# Patient Record
Sex: Male | Born: 1985 | Race: Black or African American | Hispanic: No | Marital: Single | Smoking: Current some day smoker
Health system: Southern US, Community
[De-identification: ages and names within clinical notes are randomized; demographics above are authoritative.]

## PROBLEM LIST (undated history)

## (undated) DIAGNOSIS — I10 Essential (primary) hypertension: Secondary | ICD-10-CM

## (undated) DIAGNOSIS — W3400XA Accidental discharge from unspecified firearms or gun, initial encounter: Secondary | ICD-10-CM

## (undated) HISTORY — PX: ABDOMINAL SURGERY: SHX537

---
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
## 2008-07-24 ENCOUNTER — Emergency Department (HOSPITAL_COMMUNITY): Admission: EM | Admit: 2008-07-24 | Discharge: 2008-07-24 | Payer: Self-pay | Admitting: Emergency Medicine

## 2009-09-10 ENCOUNTER — Encounter (INDEPENDENT_AMBULATORY_CARE_PROVIDER_SITE_OTHER): Payer: Self-pay | Admitting: General Surgery

## 2009-09-10 ENCOUNTER — Inpatient Hospital Stay (HOSPITAL_COMMUNITY): Admission: AC | Admit: 2009-09-10 | Discharge: 2009-09-18 | Payer: Self-pay

## 2009-09-10 ENCOUNTER — Ambulatory Visit: Payer: Self-pay | Admitting: Vascular Surgery

## 2009-09-25 ENCOUNTER — Ambulatory Visit: Payer: Self-pay | Admitting: Family Medicine

## 2009-09-25 DIAGNOSIS — S35516A Injury of unspecified iliac vein, initial encounter: Secondary | ICD-10-CM | POA: Insufficient documentation

## 2009-09-25 LAB — CONVERTED CEMR LAB: INR: 1.7

## 2009-10-02 ENCOUNTER — Ambulatory Visit: Payer: Self-pay | Admitting: Family Medicine

## 2009-10-02 LAB — CONVERTED CEMR LAB: INR: 1.7

## 2009-10-16 ENCOUNTER — Ambulatory Visit: Payer: Self-pay | Admitting: Family Medicine

## 2009-10-16 LAB — CONVERTED CEMR LAB: INR: 2

## 2009-10-30 ENCOUNTER — Ambulatory Visit: Payer: Self-pay | Admitting: Family Medicine

## 2009-10-30 LAB — CONVERTED CEMR LAB: INR: 1.6

## 2010-04-18 ENCOUNTER — Emergency Department (HOSPITAL_COMMUNITY): Admission: EM | Admit: 2010-04-18 | Discharge: 2010-04-18 | Payer: Self-pay | Admitting: Emergency Medicine

## 2010-12-08 ENCOUNTER — Encounter: Payer: Self-pay | Admitting: *Deleted

## 2011-02-06 LAB — BASIC METABOLIC PANEL
BUN: 13 mg/dL (ref 6–23)
BUN: 5 mg/dL — ABNORMAL LOW (ref 6–23)
BUN: 6 mg/dL (ref 6–23)
BUN: 8 mg/dL (ref 6–23)
CO2: 23 mEq/L (ref 19–32)
CO2: 27 mEq/L (ref 19–32)
CO2: 29 mEq/L (ref 19–32)
CO2: 29 mEq/L (ref 19–32)
Calcium: 7.1 mg/dL — ABNORMAL LOW (ref 8.4–10.5)
Calcium: 7.2 mg/dL — ABNORMAL LOW (ref 8.4–10.5)
Calcium: 7.4 mg/dL — ABNORMAL LOW (ref 8.4–10.5)
Calcium: 8.1 mg/dL — ABNORMAL LOW (ref 8.4–10.5)
Chloride: 102 mEq/L (ref 96–112)
Chloride: 120 meq/L — ABNORMAL HIGH (ref 96–112)
Creatinine, Ser: 0.67 mg/dL (ref 0.4–1.5)
Creatinine, Ser: 0.67 mg/dL (ref 0.4–1.5)
Creatinine, Ser: 0.7 mg/dL (ref 0.4–1.5)
Creatinine, Ser: 0.8 mg/dL (ref 0.4–1.5)
GFR calc Af Amer: >60 mL/min (ref >60)
GFR calc Af Amer: >60 mL/min (ref >60)
GFR calc Af Amer: >60 mL/min (ref >60)
GFR calc non Af Amer: >60 mL/min (ref >60)
GFR calc non Af Amer: >60 mL/min (ref >60)
GFR calc non Af Amer: >60 mL/min (ref >60)
GFR calc non Af Amer: >60 mL/min (ref >60)
Glucose, Bld: 117 mg/dL — ABNORMAL HIGH (ref 70–99)
Glucose, Bld: 117 mg/dL — ABNORMAL HIGH (ref 70–99)
Glucose, Bld: 122 mg/dL — ABNORMAL HIGH (ref 70–99)
Glucose, Bld: 143 mg/dL — ABNORMAL HIGH (ref 70–99)
Potassium: 3.4 mEq/L — ABNORMAL LOW (ref 3.5–5.1)
Potassium: 3.4 mEq/L — ABNORMAL LOW (ref 3.5–5.1)
Potassium: 3.5 mEq/L (ref 3.5–5.1)
Potassium: 3.8 mEq/L (ref 3.5–5.1)
Sodium: 136 mEq/L (ref 135–145)
Sodium: 139 mEq/L (ref 135–145)
Sodium: 149 mEq/L — ABNORMAL HIGH (ref 135–145)

## 2011-02-06 LAB — URINALYSIS, MICROSCOPIC ONLY
Bilirubin Urine: NEGATIVE
Nitrite: NEGATIVE
Specific Gravity, Urine: 1.029 (ref 1.005–1.030)
pH: 7 (ref 5.0–8.0)

## 2011-02-06 LAB — POCT I-STAT 3, ART BLOOD GAS (G3+)
Acid-Base Excess: 3 mmol/L — ABNORMAL HIGH (ref 0.0–2.0)
O2 Saturation: 98 %
Patient temperature: 38
TCO2: 24 mmol/L (ref 0–100)
pCO2 arterial: 43.9 mmHg (ref 35.0–45.0)
pH, Arterial: 7.306 — ABNORMAL LOW (ref 7.350–7.450)
pO2, Arterial: 110 mmHg — ABNORMAL HIGH (ref 80.0–100.0)
pO2, Arterial: 593 mmHg — ABNORMAL HIGH (ref 80.0–100.0)

## 2011-02-06 LAB — CBC
HCT: 21.5 % — ABNORMAL LOW (ref 39.0–52.0)
HCT: 24.6 % — ABNORMAL LOW (ref 39.0–52.0)
HCT: 26.5 % — ABNORMAL LOW (ref 39.0–52.0)
HCT: 26.9 % — ABNORMAL LOW (ref 39.0–52.0)
HCT: 27.2 % — ABNORMAL LOW (ref 39.0–52.0)
HCT: 27.8 % — ABNORMAL LOW (ref 39.0–52.0)
HCT: 28.6 % — ABNORMAL LOW (ref 39.0–52.0)
HCT: 29.5 % — ABNORMAL LOW (ref 39.0–52.0)
HCT: 30 % — ABNORMAL LOW (ref 39.0–52.0)
HCT: 32.1 % — ABNORMAL LOW (ref 39.0–52.0)
HCT: 37.5 % — ABNORMAL LOW (ref 39.0–52.0)
HCT: 38.1 % — ABNORMAL LOW (ref 39.0–52.0)
Hemoglobin: 10.4 g/dL — ABNORMAL LOW (ref 13.0–17.0)
Hemoglobin: 10.5 g/dL — ABNORMAL LOW (ref 13.0–17.0)
Hemoglobin: 10.6 g/dL — ABNORMAL LOW (ref 13.0–17.0)
Hemoglobin: 11.2 g/dL — ABNORMAL LOW (ref 13.0–17.0)
Hemoglobin: 13.1 g/dL (ref 13.0–17.0)
Hemoglobin: 13.1 g/dL (ref 13.0–17.0)
Hemoglobin: 7.8 g/dL — ABNORMAL LOW (ref 13.0–17.0)
Hemoglobin: 9.4 g/dL — ABNORMAL LOW (ref 13.0–17.0)
Hemoglobin: 9.5 g/dL — ABNORMAL LOW (ref 13.0–17.0)
Hemoglobin: 9.6 g/dL — ABNORMAL LOW (ref 13.0–17.0)
Hemoglobin: 9.8 g/dL — ABNORMAL LOW (ref 13.0–17.0)
MCHC: 34.7 g/dL (ref 30.0–36.0)
MCHC: 34.9 g/dL (ref 30.0–36.0)
MCHC: 35 g/dL (ref 30.0–36.0)
MCHC: 35 g/dL (ref 30.0–36.0)
MCHC: 35 g/dL (ref 30.0–36.0)
MCHC: 35.1 g/dL (ref 30.0–36.0)
MCHC: 35.1 g/dL (ref 30.0–36.0)
MCHC: 35.3 g/dL (ref 30.0–36.0)
MCHC: 35.3 g/dL (ref 30.0–36.0)
MCHC: 35.3 g/dL (ref 30.0–36.0)
MCHC: 35.4 g/dL (ref 30.0–36.0)
MCHC: 36 g/dL (ref 30.0–36.0)
MCV: 86.8 fL (ref 78.0–100.0)
MCV: 87.6 fL (ref 78.0–100.0)
MCV: 87.7 fL (ref 78.0–100.0)
MCV: 87.7 fL (ref 78.0–100.0)
MCV: 87.8 fL (ref 78.0–100.0)
MCV: 88.1 fL (ref 78.0–100.0)
MCV: 88.3 fL (ref 78.0–100.0)
MCV: 88.3 fL (ref 78.0–100.0)
MCV: 88.7 fL (ref 78.0–100.0)
MCV: 89.5 fL (ref 78.0–100.0)
MCV: 90.5 fL (ref 78.0–100.0)
Platelets: 115 10*3/uL — ABNORMAL LOW (ref 150–400)
Platelets: 24 10*3/uL — CL (ref 150–400)
Platelets: 29 10*3/uL — CL (ref 150–400)
Platelets: 58 10*3/uL — ABNORMAL LOW (ref 150–400)
Platelets: 61 10*3/uL — ABNORMAL LOW (ref 150–400)
Platelets: 61 10*3/uL — ABNORMAL LOW (ref 150–400)
Platelets: 61 10*3/uL — ABNORMAL LOW (ref 150–400)
Platelets: 63 10*3/uL — ABNORMAL LOW (ref 150–400)
Platelets: 64 10*3/uL — ABNORMAL LOW (ref 150–400)
Platelets: 65 10*3/uL — ABNORMAL LOW (ref 150–400)
Platelets: 73 10*3/uL — ABNORMAL LOW (ref 150–400)
Platelets: 74 10*3/uL — ABNORMAL LOW (ref 150–400)
RBC: 2.67 MIL/uL — ABNORMAL LOW (ref 4.22–5.81)
RBC: 3.07 MIL/uL — ABNORMAL LOW (ref 4.22–5.81)
RBC: 3.1 MIL/uL — ABNORMAL LOW (ref 4.22–5.81)
RBC: 3.18 MIL/uL — ABNORMAL LOW (ref 4.22–5.81)
RBC: 3.26 MIL/uL — ABNORMAL LOW (ref 4.22–5.81)
RBC: 3.31 MIL/uL — ABNORMAL LOW (ref 4.22–5.81)
RBC: 3.35 MIL/uL — ABNORMAL LOW (ref 4.22–5.81)
RBC: 3.4 MIL/uL — ABNORMAL LOW (ref 4.22–5.81)
RBC: 3.44 MIL/uL — ABNORMAL LOW (ref 4.22–5.81)
RBC: 3.62 MIL/uL — ABNORMAL LOW (ref 4.22–5.81)
RBC: 4.08 MIL/uL — ABNORMAL LOW (ref 4.22–5.81)
RBC: 4.25 MIL/uL (ref 4.22–5.81)
RDW: 12.8 % (ref 11.5–15.5)
RDW: 13.1 % (ref 11.5–15.5)
RDW: 13.4 % (ref 11.5–15.5)
RDW: 13.6 % (ref 11.5–15.5)
RDW: 13.7 % (ref 11.5–15.5)
RDW: 13.9 % (ref 11.5–15.5)
RDW: 14.2 % (ref 11.5–15.5)
RDW: 14.3 % (ref 11.5–15.5)
RDW: 14.4 % (ref 11.5–15.5)
RDW: 14.6 % (ref 11.5–15.5)
RDW: 14.6 % (ref 11.5–15.5)
RDW: 14.6 % (ref 11.5–15.5)
RDW: 14.7 % (ref 11.5–15.5)
RDW: 14.7 % (ref 11.5–15.5)
RDW: 14.8 % (ref 11.5–15.5)
WBC: 3.5 10*3/uL — ABNORMAL LOW (ref 4.0–10.5)
WBC: 4.1 10*3/uL (ref 4.0–10.5)
WBC: 4.7 10*3/uL (ref 4.0–10.5)
WBC: 5.4 10*3/uL (ref 4.0–10.5)
WBC: 6.1 10*3/uL (ref 4.0–10.5)
WBC: 6.1 10*3/uL (ref 4.0–10.5)
WBC: 6.6 10*3/uL (ref 4.0–10.5)
WBC: 7.1 10*3/uL (ref 4.0–10.5)
WBC: 7.1 10*3/uL (ref 4.0–10.5)
WBC: 7.1 10*3/uL (ref 4.0–10.5)
WBC: 7.2 10*3/uL (ref 4.0–10.5)
WBC: 7.4 10*3/uL (ref 4.0–10.5)

## 2011-02-06 LAB — DIFFERENTIAL
Basophils Absolute: 0 10*3/uL (ref 0.0–0.1)
Basophils Absolute: 0 10*3/uL (ref 0.0–0.1)
Basophils Relative: 0 % (ref 0–1)
Basophils Relative: 0 % (ref 0–1)
Eosinophils Absolute: 0 10*3/uL (ref 0.0–0.7)
Eosinophils Relative: 1 % (ref 0–5)
Lymphocytes Relative: 10 % — ABNORMAL LOW (ref 12–46)
Lymphocytes Relative: 27 % (ref 12–46)
Lymphs Abs: 1.6 10*3/uL (ref 0.7–4.0)
Monocytes Absolute: 0.4 10*3/uL (ref 0.1–1.0)
Monocytes Relative: 10 % (ref 3–12)
Monocytes Relative: 6 % (ref 3–12)
Neutro Abs: 4 10*3/uL (ref 1.7–7.7)
Neutro Abs: 4.6 10*3/uL (ref 1.7–7.7)
Neutrophils Relative %: 67 % (ref 43–77)
Neutrophils Relative %: 77 % (ref 43–77)

## 2011-02-06 LAB — TYPE AND SCREEN

## 2011-02-06 LAB — POCT I-STAT 7, (LYTES, BLD GAS, ICA,H+H)
Acid-base deficit: 22 mmol/L — ABNORMAL HIGH (ref 0.0–2.0)
Acid-base deficit: 3 mmol/L — ABNORMAL HIGH (ref 0.0–2.0)
Bicarbonate: 11.8 meq/L — ABNORMAL LOW (ref 20.0–24.0)
Bicarbonate: 22.8 meq/L (ref 20.0–24.0)
Calcium, Ion: 1.02 mmol/L — ABNORMAL LOW (ref 1.12–1.32)
Calcium, Ion: 1.1 mmol/L — ABNORMAL LOW (ref 1.12–1.32)
HCT: 19 % — ABNORMAL LOW (ref 39.0–52.0)
Hemoglobin: 5.4 g/dL — CL (ref 13.0–17.0)
Hemoglobin: 6.5 g/dL — CL (ref 13.0–17.0)
Hemoglobin: 8.8 g/dL — ABNORMAL LOW (ref 13.0–17.0)
Hemoglobin: 9.5 g/dL — ABNORMAL LOW (ref 13.0–17.0)
O2 Saturation: 100 %
O2 Saturation: 100 %
Patient temperature: 33.4
Patient temperature: 34
Patient temperature: 34.3
Potassium: 3.8 meq/L (ref 3.5–5.1)
Potassium: 4.4 meq/L (ref 3.5–5.1)
Potassium: 4.5 meq/L (ref 3.5–5.1)
Potassium: 5.6 meq/L — ABNORMAL HIGH (ref 3.5–5.1)
Potassium: 5.6 meq/L — ABNORMAL HIGH (ref 3.5–5.1)
Sodium: 147 meq/L — ABNORMAL HIGH (ref 135–145)
Sodium: 147 meq/L — ABNORMAL HIGH (ref 135–145)
Sodium: 150 meq/L — ABNORMAL HIGH (ref 135–145)
TCO2: 13 mmol/L (ref 0–100)
TCO2: 13 mmol/L (ref 0–100)
TCO2: 22 mmol/L (ref 0–100)
TCO2: 24 mmol/L (ref 0–100)
TCO2: 24 mmol/L (ref 0–100)
pCO2 arterial: 41 mmHg (ref 35.0–45.0)
pCO2 arterial: 42.5 mmHg (ref 35.0–45.0)
pCO2 arterial: 43.8 mmHg (ref 35.0–45.0)
pH, Arterial: 7.028 — CL (ref 7.350–7.450)
pH, Arterial: 7.296 — ABNORMAL LOW (ref 7.350–7.450)
pH, Arterial: 7.315 — ABNORMAL LOW (ref 7.350–7.450)
pH, Arterial: 7.34 — ABNORMAL LOW (ref 7.350–7.450)
pO2, Arterial: 516 mmHg — ABNORMAL HIGH (ref 80.0–100.0)
pO2, Arterial: 602 mmHg — ABNORMAL HIGH (ref 80.0–100.0)

## 2011-02-06 LAB — BASIC METABOLIC PANEL WITH GFR
BUN: 7 mg/dL (ref 6–23)
CO2: 23 meq/L (ref 19–32)
Calcium: 7.1 mg/dL — ABNORMAL LOW (ref 8.4–10.5)
Chloride: 118 meq/L — ABNORMAL HIGH (ref 96–112)
Creatinine, Ser: 0.69 mg/dL (ref 0.4–1.5)
GFR calc non Af Amer: >60 mL/min
Glucose, Bld: 141 mg/dL — ABNORMAL HIGH (ref 70–99)
Potassium: 4 meq/L (ref 3.5–5.1)
Sodium: 146 meq/L — ABNORMAL HIGH (ref 135–145)

## 2011-02-06 LAB — PREPARE FRESH FROZEN PLASMA

## 2011-02-06 LAB — COMPREHENSIVE METABOLIC PANEL
ALT: 28 U/L (ref 0–53)
Alkaline Phosphatase: 64 U/L (ref 39–117)
CO2: 11 mEq/L — ABNORMAL LOW (ref 19–32)
Chloride: 113 mEq/L — ABNORMAL HIGH (ref 96–112)
Glucose, Bld: 214 mg/dL — ABNORMAL HIGH (ref 70–99)
Potassium: 4.3 mEq/L (ref 3.5–5.1)
Sodium: 144 mEq/L (ref 135–145)
Total Bilirubin: 0.4 mg/dL (ref 0.3–1.2)
Total Protein: 6.9 g/dL (ref 6.0–8.3)

## 2011-02-06 LAB — COMPREHENSIVE METABOLIC PANEL WITH GFR
ALT: 85 U/L — ABNORMAL HIGH (ref 0–53)
AST: 65 U/L — ABNORMAL HIGH (ref 0–37)
Albumin: 1.9 g/dL — ABNORMAL LOW (ref 3.5–5.2)
Alkaline Phosphatase: 37 U/L — ABNORMAL LOW (ref 39–117)
BUN: 7 mg/dL (ref 6–23)
CO2: 29 meq/L (ref 19–32)
Calcium: 7.1 mg/dL — ABNORMAL LOW (ref 8.4–10.5)
Chloride: 114 meq/L — ABNORMAL HIGH (ref 96–112)
Creatinine, Ser: 0.87 mg/dL (ref 0.4–1.5)
GFR calc non Af Amer: >60 mL/min
Glucose, Bld: 104 mg/dL — ABNORMAL HIGH (ref 70–99)
Potassium: 3.5 meq/L (ref 3.5–5.1)
Sodium: 147 meq/L — ABNORMAL HIGH (ref 135–145)
Total Bilirubin: 1 mg/dL (ref 0.3–1.2)
Total Protein: 3.3 g/dL — ABNORMAL LOW (ref 6.0–8.3)

## 2011-02-06 LAB — URINALYSIS, ROUTINE W REFLEX MICROSCOPIC
Bilirubin Urine: NEGATIVE
Glucose, UA: NEGATIVE mg/dL
Hgb urine dipstick: NEGATIVE
Ketones, ur: NEGATIVE mg/dL
Nitrite: NEGATIVE
Protein, ur: NEGATIVE mg/dL
Specific Gravity, Urine: 1.028 (ref 1.005–1.030)
Urobilinogen, UA: 0.2 mg/dL (ref 0.0–1.0)
pH: 6 (ref 5.0–8.0)

## 2011-02-06 LAB — CULTURE, ROUTINE-GENITAL: Culture: NO GROWTH

## 2011-02-06 LAB — PREPARE PLATELETS

## 2011-02-06 LAB — ABO/RH: ABO/RH(D): A POS

## 2011-02-06 LAB — PROTIME-INR
INR: 1.12 (ref 0.00–1.49)
INR: 1.2 (ref 0.00–1.49)
INR: 1.29 (ref 0.00–1.49)
INR: 1.3 (ref 0.00–1.49)
INR: 2.63 — ABNORMAL HIGH (ref 0.00–1.49)
INR: 2.9 — ABNORMAL HIGH (ref 0.00–1.49)
INR: 3.05 — ABNORMAL HIGH (ref 0.00–1.49)
Prothrombin Time: 14 seconds (ref 11.6–15.2)
Prothrombin Time: 14.1 seconds (ref 11.6–15.2)
Prothrombin Time: 14.3 seconds (ref 11.6–15.2)
Prothrombin Time: 14.3 seconds (ref 11.6–15.2)
Prothrombin Time: 15.1 s (ref 11.6–15.2)
Prothrombin Time: 16 seconds — ABNORMAL HIGH (ref 11.6–15.2)
Prothrombin Time: 16.1 s — ABNORMAL HIGH (ref 11.6–15.2)
Prothrombin Time: 16.4 seconds — ABNORMAL HIGH (ref 11.6–15.2)
Prothrombin Time: 27.9 s — ABNORMAL HIGH (ref 11.6–15.2)
Prothrombin Time: 33.4 s — ABNORMAL HIGH (ref 11.6–15.2)

## 2011-02-06 LAB — POCT I-STAT 4, (NA,K, GLUC, HGB,HCT)
HCT: 24 % — ABNORMAL LOW (ref 39.0–52.0)
Hemoglobin: 8.2 g/dL — ABNORMAL LOW (ref 13.0–17.0)

## 2011-02-06 LAB — APTT: aPTT: 95 s — ABNORMAL HIGH (ref 24–37)

## 2011-02-06 LAB — HEMOGLOBIN AND HEMATOCRIT, BLOOD: Hemoglobin: 9.8 g/dL — ABNORMAL LOW (ref 13.0–17.0)

## 2011-02-06 LAB — D-DIMER, QUANTITATIVE: D-Dimer, Quant: 9.66 ug{FEU}/mL — ABNORMAL HIGH (ref 0.00–0.48)

## 2011-11-23 ENCOUNTER — Emergency Department (HOSPITAL_COMMUNITY)
Admission: EM | Admit: 2011-11-23 | Discharge: 2011-11-23 | Disposition: A | Discharge status: Home / Self Care | Payer: Self-pay | Attending: Emergency Medicine | Admitting: Emergency Medicine

## 2011-11-23 ENCOUNTER — Encounter (HOSPITAL_COMMUNITY): Payer: Self-pay

## 2011-11-23 DIAGNOSIS — L0201 Cutaneous abscess of face: Secondary | ICD-10-CM | POA: Insufficient documentation

## 2011-11-23 DIAGNOSIS — R220 Localized swelling, mass and lump, head: Secondary | ICD-10-CM | POA: Insufficient documentation

## 2011-11-23 DIAGNOSIS — L03211 Cellulitis of face: Secondary | ICD-10-CM

## 2011-11-23 MED ORDER — DOXYCYCLINE HYCLATE 100 MG PO CAPS: 100.0000 mg | ORAL_CAPSULE | Freq: Two times a day (BID) | ORAL | Status: AC
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
## 2011-11-23 NOTE — ED Notes (Signed)
Yesterday started having pain today woke up with facial swelling on left lower jaw.

## 2011-11-24 NOTE — ED Provider Notes (Signed)
Medical screening examination/treatment/procedure(s) were performed by non-physician practitioner and as supervising physician I was immediately available for consultation/collaboration.  Shihab States, MD 11/24/11 1145 

## 2012-01-25 NOTE — ED Provider Notes (Signed)
Medical screening examination/treatment/procedure(s) were performed by non-physician practitioner and as supervising physician I was immediately available for consultation/collaboration.  Milo Schreier, MD 01/25/12 2050 

## 2013-02-18 ENCOUNTER — Emergency Department (HOSPITAL_COMMUNITY)
Admission: EM | Admit: 2013-02-18 | Discharge: 2013-02-18 | Disposition: A | Discharge status: Home / Self Care | Payer: Self-pay | Attending: Emergency Medicine | Admitting: Emergency Medicine

## 2013-02-18 ENCOUNTER — Encounter (HOSPITAL_COMMUNITY): Payer: Self-pay | Admitting: *Deleted

## 2013-02-18 DIAGNOSIS — F17200 Nicotine dependence, unspecified, uncomplicated: Secondary | ICD-10-CM | POA: Insufficient documentation

## 2013-02-18 DIAGNOSIS — H1131 Conjunctival hemorrhage, right eye: Secondary | ICD-10-CM

## 2013-02-18 DIAGNOSIS — Z87828 Personal history of other (healed) physical injury and trauma: Secondary | ICD-10-CM | POA: Insufficient documentation

## 2013-02-18 DIAGNOSIS — H1130 Conjunctival hemorrhage, unspecified eye: Secondary | ICD-10-CM | POA: Insufficient documentation

## 2013-02-18 DIAGNOSIS — I10 Essential (primary) hypertension: Secondary | ICD-10-CM | POA: Insufficient documentation

## 2013-02-18 HISTORY — DX: Accidental discharge from unspecified firearms or gun, initial encounter: W34.00XA

## 2013-02-18 HISTORY — DX: Essential (primary) hypertension: I10
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
## 2013-02-18 NOTE — ED Notes (Signed)
To ED for eval of redness to right eye after vomiting yesterday. No vision changes.

## 2013-02-18 NOTE — ED Provider Notes (Signed)
Medical screening examination/treatment/procedure(s) were performed by non-physician practitioner and as supervising physician I was immediately available for consultation/collaboration.  Samer Dutton, MD 02/18/13 1624 

## 2013-08-02 ENCOUNTER — Emergency Department (INDEPENDENT_AMBULATORY_CARE_PROVIDER_SITE_OTHER)
Admission: EM | Admit: 2013-08-02 | Discharge: 2013-08-02 | Disposition: A | Discharge status: Home / Self Care | Payer: Self-pay | Source: Home / Self Care | Attending: Emergency Medicine | Admitting: Emergency Medicine

## 2013-08-02 ENCOUNTER — Other Ambulatory Visit (HOSPITAL_COMMUNITY)
Admission: RE | Admit: 2013-08-02 | Discharge: 2013-08-02 | Disposition: A | Discharge status: Home / Self Care | Payer: Self-pay | Source: Ambulatory Visit | Attending: Family Medicine | Admitting: Family Medicine

## 2013-08-02 ENCOUNTER — Encounter (HOSPITAL_COMMUNITY): Payer: Self-pay | Admitting: Emergency Medicine

## 2013-08-02 DIAGNOSIS — L0291 Cutaneous abscess, unspecified: Secondary | ICD-10-CM

## 2013-08-02 DIAGNOSIS — Z202 Contact with and (suspected) exposure to infections with a predominantly sexual mode of transmission: Secondary | ICD-10-CM

## 2013-08-02 DIAGNOSIS — Z2089 Contact with and (suspected) exposure to other communicable diseases: Secondary | ICD-10-CM

## 2013-08-02 DIAGNOSIS — Z113 Encounter for screening for infections with a predominantly sexual mode of transmission: Secondary | ICD-10-CM | POA: Insufficient documentation

## 2013-08-02 DIAGNOSIS — I10 Essential (primary) hypertension: Secondary | ICD-10-CM

## 2013-08-02 DIAGNOSIS — B356 Tinea cruris: Secondary | ICD-10-CM

## 2013-08-02 LAB — POCT I-STAT, CHEM 8
Calcium, Ion: 1.08 mmol/L — ABNORMAL LOW (ref 1.12–1.23)
Chloride: 105 mEq/L (ref 96–112)
HCT: 50 % (ref 39.0–52.0)
Hemoglobin: 17 g/dL (ref 13.0–17.0)
TCO2: 25 mmol/L (ref 0–100)

## 2013-08-02 MED ORDER — MUPIROCIN CALCIUM 2 % NA OINT: TOPICAL_OINTMENT | NASAL | Status: DC

## 2013-08-02 MED ORDER — LISINOPRIL 10 MG PO TABS: 10.0000 mg | ORAL_TABLET | Freq: Every day | ORAL | Status: DC

## 2013-08-02 MED ORDER — AZITHROMYCIN 250 MG PO TABS
ORAL_TABLET | ORAL | Status: AC
  Filled 2013-08-02: qty 4

## 2013-08-02 MED ORDER — CEFTRIAXONE SODIUM 250 MG IJ SOLR
INTRAMUSCULAR | Status: AC
  Filled 2013-08-02: qty 250

## 2013-08-02 MED ORDER — SULFAMETHOXAZOLE-TRIMETHOPRIM 800-160 MG PO TABS: 1.0000 | ORAL_TABLET | Freq: Two times a day (BID) | ORAL | Status: DC

## 2013-08-02 MED ORDER — IBUPROFEN 800 MG PO TABS
ORAL_TABLET | ORAL | Status: AC
  Filled 2013-08-02: qty 1

## 2013-08-02 MED ORDER — KETOCONAZOLE 2 % EX CREA: TOPICAL_CREAM | Freq: Every day | CUTANEOUS | Status: DC

## 2013-08-02 MED ORDER — AZITHROMYCIN 250 MG PO TABS
1000.0000 mg | ORAL_TABLET | Freq: Once | ORAL | Status: AC
  Administered 2013-08-02: 1000 mg via ORAL

## 2013-08-02 MED ORDER — CEFTRIAXONE SODIUM 250 MG IJ SOLR
250.0000 mg | Freq: Once | INTRAMUSCULAR | Status: AC
  Administered 2013-08-02: 250 mg via INTRAMUSCULAR

## 2013-08-02 MED ORDER — LIDOCAINE HCL (PF) 1 % IJ SOLN
INTRAMUSCULAR | Status: AC
  Filled 2013-08-02: qty 5

## 2013-08-02 MED ORDER — IBUPROFEN 800 MG PO TABS
800.0000 mg | ORAL_TABLET | Freq: Once | ORAL | Status: AC
  Administered 2013-08-02: 800 mg via ORAL

## 2013-08-02 NOTE — ED Provider Notes (Signed)
Medical screening examination/treatment/procedure(s) were performed by non-physician practitioner and as supervising physician I was immediately available for consultation/collaboration.  Leslee Home, M.D.  Reuben Likes, MD 08/02/13 Rickey Primus

## 2013-08-02 NOTE — ED Notes (Signed)
Abscess right elbow, open wound, draining.  Patient reports a history of having an abscess on right left before
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
## 2013-08-05 LAB — CULTURE, ROUTINE-ABSCESS

## 2013-08-06 NOTE — ED Notes (Signed)
Chart review.

## 2013-08-09 ENCOUNTER — Telehealth (HOSPITAL_COMMUNITY): Payer: Self-pay | Admitting: *Deleted

## 2013-08-09 NOTE — ED Notes (Signed)
GC/Chlamydia/Trich neg., Abscess culture R elbow:  Abundant MRSA.  Pt. adequately treated with Septra DS.  I called pt. Wife answered and offered to put him on 3 way call.  I told her I needed to give him the results and asked her to have him call me.  She said " well if he has something then I have it." I asked her, to have him call me.  A few minutes later a person called back with a male sounding voice identifying themselves as Mr. Lobdell. I verified the SS number and address. The address in the system is [address] and she said [address]. I said it was not correct.  She gave 2 other street names and then said "I don't see what the problem is, just give me the results." I told her I was hesitant to give the results. She asked to speak to my supervisor. Dois Davenport was not here, so I asked Michiel Cowboy RN to talk to her.  She also felt like this person sounded like a male and instructed them to come with a picture ID to get the results.

## 2013-08-09 NOTE — ED Notes (Addendum)
I called pt. Pt. verified x 2 and given results.  Pt. told he was adequately treated with Septra DS.  I reviewed the [facility] MRSA instructions with him.  Pt. voiced understanding.  This person sounded like a man and nothing like the person I talked to on Friday.  Address also given as [address] and corrected in the system.  Vassie Moselle 08/09/2013

## 2013-10-20 ENCOUNTER — Emergency Department (HOSPITAL_COMMUNITY)
Admission: EM | Admit: 2013-10-20 | Discharge: 2013-10-20 | Disposition: A | Discharge status: Home / Self Care | Payer: Self-pay | Attending: Emergency Medicine | Admitting: Emergency Medicine

## 2013-10-20 ENCOUNTER — Encounter (HOSPITAL_COMMUNITY): Payer: Self-pay | Admitting: Emergency Medicine

## 2013-10-20 ENCOUNTER — Emergency Department (HOSPITAL_COMMUNITY): Payer: Self-pay

## 2013-10-20 DIAGNOSIS — R0602 Shortness of breath: Secondary | ICD-10-CM | POA: Insufficient documentation

## 2013-10-20 DIAGNOSIS — Z79899 Other long term (current) drug therapy: Secondary | ICD-10-CM | POA: Insufficient documentation

## 2013-10-20 DIAGNOSIS — I10 Essential (primary) hypertension: Secondary | ICD-10-CM | POA: Insufficient documentation

## 2013-10-20 DIAGNOSIS — R61 Generalized hyperhidrosis: Secondary | ICD-10-CM | POA: Insufficient documentation

## 2013-10-20 DIAGNOSIS — F17200 Nicotine dependence, unspecified, uncomplicated: Secondary | ICD-10-CM | POA: Insufficient documentation

## 2013-10-20 DIAGNOSIS — B9789 Other viral agents as the cause of diseases classified elsewhere: Secondary | ICD-10-CM

## 2013-10-20 DIAGNOSIS — R51 Headache: Secondary | ICD-10-CM | POA: Insufficient documentation

## 2013-10-20 DIAGNOSIS — J069 Acute upper respiratory infection, unspecified: Secondary | ICD-10-CM | POA: Insufficient documentation

## 2013-10-20 MED ORDER — ALBUTEROL SULFATE HFA 108 (90 BASE) MCG/ACT IN AERS
2.0000 | INHALATION_SPRAY | Freq: Once | RESPIRATORY_TRACT | Status: AC
  Administered 2013-10-20: 2 via RESPIRATORY_TRACT
  Filled 2013-10-20: qty 6.7

## 2013-10-20 MED ORDER — HYDROCOD POLST-CHLORPHEN POLST 10-8 MG/5ML PO LQCR: 5.0000 mL | Freq: Two times a day (BID) | ORAL | Status: DC | PRN

## 2013-10-20 NOTE — ED Notes (Signed)
Pt states he's had a productive cough with yellow sputum x 4 days.  Pt states he thinks he has bronchitis.  Pt also c/o headache rated 8/10.  Pt has not taken his temp, but feels he may have a fever.
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
## 2013-10-20 NOTE — Discharge Instructions (Signed)
Read the information below.  Use the prescribed medication as directed.  Please discuss all new medications with your pharmacist.  You may return to the Emergency Department at any time for worsening condition or any new symptoms that concern you.  If you develop high fevers that do not resolve with tylenol or ibuprofen, you have difficulty swallowing or breathing, or you are unable to tolerate fluids by mouth, return to the ER for a recheck.      Antibiotic Nonuse  Your caregiver felt that the infection or problem was not one that would be helped with an antibiotic. Infections may be caused by viruses or bacteria. Only a caregiver can tell which one of these is the likely cause of an illness. A cold is the most common cause of infection in both adults and children. A cold is a virus. Antibiotic treatment will have no effect on a viral infection. Viruses can lead to many lost days of work caring for sick children and many missed days of school. Children may catch as many as 10 "colds" or "flus" per year during which they can be tearful, cranky, and uncomfortable. The goal of treating a virus is aimed at keeping the ill person comfortable. Antibiotics are medications used to help the body fight bacterial infections. There are relatively few types of bacteria that cause infections but there are hundreds of viruses. While both viruses and bacteria cause infection they are very different types of germs. A viral infection will typically go away by itself within 7 to 10 days. Bacterial infections may spread or get worse without antibiotic treatment. Examples of bacterial infections are:  Sore throats (like strep throat or tonsillitis).  Infection in the lung (pneumonia).  Ear and skin infections. Examples of viral infections are:  Colds or flus.  Most coughs and bronchitis.  Sore throats not caused by Strep.  Runny noses. It is often best not to take an antibiotic when a viral infection is the cause  of the problem. Antibiotics can kill off the helpful bacteria that we have inside our body and allow harmful bacteria to start growing. Antibiotics can cause side effects such as allergies, nausea, and diarrhea without helping to improve the symptoms of the viral infection. Additionally, repeated uses of antibiotics can cause bacteria inside of our body to become resistant. That resistance can be passed onto harmful bacterial. The next time you have an infection it may be harder to treat if antibiotics are used when they are not needed. Not treating with antibiotics allows our own immune system to develop and take care of infections more efficiently. Also, antibiotics will work better for us when they are prescribed for bacterial infections. Treatments for a child that is ill may include:  Give extra fluids throughout the day to stay hydrated.  Get plenty of rest.  Only give your child over-the-counter or prescription medicines for pain, discomfort, or fever as directed by your caregiver.  The use of a cool mist humidifier may help stuffy noses.  Cold medications if suggested by your caregiver. Your caregiver may decide to start you on an antibiotic if:  The problem you were seen for today continues for a longer length of time than expected.  You develop a secondary bacterial infection. SEEK MEDICAL CARE IF:  Fever lasts longer than 5 days.  Symptoms continue to get worse after 5 to 7 days or become severe.  Difficulty in breathing develops.  Signs of dehydration develop (poor drinking, rare urinating, dark colored  urine).  Changes in behavior or worsening tiredness (listlessness or lethargy). Document Released: 12/30/2001 Document Revised: 01/13/2012 Document Reviewed: 06/28/2009 [facility] Patient Information 2014 [place], [place].  Viral Infections A viral infection can be caused by different types of viruses.Most viral infections are not serious and resolve on their own. However,  some infections may cause severe symptoms and may lead to further complications. SYMPTOMS Viruses can frequently cause:  Minor sore throat.  Aches and pains.  Headaches.  Runny nose.  Different types of rashes.  Watery eyes.  Tiredness.  Cough.  Loss of appetite.  Gastrointestinal infections, resulting in nausea, vomiting, and diarrhea. These symptoms do not respond to antibiotics because the infection is not caused by bacteria. However, you might catch a bacterial infection following the viral infection. This is sometimes called a "superinfection." Symptoms of such a bacterial infection may include:  Worsening sore throat with pus and difficulty swallowing.  Swollen neck glands.  Chills and a high or persistent fever.  Severe headache.  Tenderness over the sinuses.  Persistent overall ill feeling (malaise), muscle aches, and tiredness (fatigue).  Persistent cough.  Yellow, green, or brown mucus production with coughing. HOME CARE INSTRUCTIONS   Only take over-the-counter or prescription medicines for pain, discomfort, diarrhea, or fever as directed by your caregiver.  Drink enough water and fluids to keep your urine clear or pale yellow. Sports drinks can provide valuable electrolytes, sugars, and hydration.  Get plenty of rest and maintain proper nutrition. Soups and broths with crackers or rice are fine. SEEK IMMEDIATE MEDICAL CARE IF:   You have severe headaches, shortness of breath, chest pain, neck pain, or an unusual rash.  You have uncontrolled vomiting, diarrhea, or you are unable to keep down fluids.  You or your child has an oral temperature above 102 F (38.9 C), not controlled by medicine.  Your baby is older than 3 months with a rectal temperature of 102 F (38.9 C) or higher.  Your baby is 55 months old or younger with a rectal temperature of 100.4 F (38 C) or higher. MAKE SURE YOU:   Understand these instructions.  Will watch your  condition.  Will get help right away if you are not doing well or get worse. Document Released: 07/31/2005 Document Revised: 01/13/2012 Document Reviewed: 02/25/2011 [facility] Patient Information 2014 [place], [place].

## 2013-10-21 NOTE — ED Provider Notes (Signed)
Medical screening examination/treatment/procedure(s) were performed by non-physician practitioner and as supervising physician I was immediately available for consultation/collaboration.  Flint Melter, MD 10/21/13 [phone]

## 2013-12-02 ENCOUNTER — Emergency Department (HOSPITAL_BASED_OUTPATIENT_CLINIC_OR_DEPARTMENT_OTHER)
Admission: EM | Admit: 2013-12-02 | Discharge: 2013-12-02 | Disposition: A | Discharge status: Home / Self Care | Payer: Self-pay | Attending: Emergency Medicine | Admitting: Emergency Medicine

## 2013-12-02 ENCOUNTER — Encounter (HOSPITAL_BASED_OUTPATIENT_CLINIC_OR_DEPARTMENT_OTHER): Payer: Self-pay | Admitting: Emergency Medicine

## 2013-12-02 DIAGNOSIS — Z87828 Personal history of other (healed) physical injury and trauma: Secondary | ICD-10-CM | POA: Insufficient documentation

## 2013-12-02 DIAGNOSIS — IMO0001 Reserved for inherently not codable concepts without codable children: Secondary | ICD-10-CM

## 2013-12-02 DIAGNOSIS — IMO0002 Reserved for concepts with insufficient information to code with codable children: Secondary | ICD-10-CM | POA: Insufficient documentation

## 2013-12-02 DIAGNOSIS — Z79899 Other long term (current) drug therapy: Secondary | ICD-10-CM | POA: Insufficient documentation

## 2013-12-02 DIAGNOSIS — F17200 Nicotine dependence, unspecified, uncomplicated: Secondary | ICD-10-CM | POA: Insufficient documentation

## 2013-12-02 DIAGNOSIS — I10 Essential (primary) hypertension: Secondary | ICD-10-CM | POA: Insufficient documentation

## 2013-12-02 MED ORDER — HYDROCODONE-ACETAMINOPHEN 5-325 MG PO TABS: 1.0000 | ORAL_TABLET | Freq: Four times a day (QID) | ORAL | Status: DC | PRN

## 2013-12-02 MED ORDER — BUPIVACAINE HCL 0.25 % IJ SOLN
INTRAMUSCULAR | Status: AC
  Administered 2013-12-02: 01:00:00
  Filled 2013-12-02: qty 1

## 2013-12-02 NOTE — Discharge Instructions (Signed)
Paronychia Paronychia is an inflammatory reaction involving the folds of the skin surrounding the fingernail. This is commonly caused by an infection in the skin around a nail. The most common cause of paronychia is frequent wetting of the hands (as seen with bartenders, food servers, nurses or others who wet their hands). This makes the skin around the fingernail susceptible to infection by bacteria (germs) or fungus. Other predisposing factors are:  Aggressive manicuring.  Nail biting.  Thumb sucking. The most common cause is a staphylococcal (a type of germ) infection, or a fungal (Candida) infection. When caused by a germ, it usually comes on suddenly with redness, swelling, pus and is often painful. It may get under the nail and form an abscess (collection of pus), or form an abscess around the nail. If the nail itself is infected with a fungus, the treatment is usually prolonged and may require oral medicine for up to one year. Your caregiver will determine the length of time treatment is required. The paronychia caused by bacteria (germs) may largely be avoided by not pulling on hangnails or picking at cuticles. When the infection occurs at the tips of the finger it is called felon. When the cause of paronychia is from the herpes simplex virus (HSV) it is called herpetic whitlow. TREATMENT  When an abscess is present treatment is often incision and drainage. This means that the abscess must be cut open so the pus can get out. When this is done, the following home care instructions should be followed. HOME CARE INSTRUCTIONS   It is important to keep the affected fingers very dry. Rubber or plastic gloves over cotton gloves should be used whenever the hand must be placed in water.  Keep wound clean, dry and dressed as suggested by your caregiver between warm soaks or warm compresses.  Soak in warm water for fifteen to twenty minutes three to four times per day for bacterial infections. Fungal  infections are very difficult to treat, so often require treatment for long periods of time.  Only take over-the-counter or prescription medicines for pain, discomfort, or fever as directed by your caregiver. SEEK IMMEDIATE MEDICAL CARE IF:  You have increased redness, swelling, or pain in the wound.  You notice increased pus coming from the wound.  You have a fever.  You notice a bad smell coming from the wound or dressing. Document Released: 04/16/2001 Document Revised: 01/13/2012 Document Reviewed: 12/16/2008 [facility] Patient Information 2014 [place].
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
## 2013-12-02 NOTE — ED Notes (Signed)
Pt has not had BP meds x 1 month

## 2013-12-02 NOTE — ED Notes (Signed)
Swelling and pain to right middle finger for few days, denies drainage or fever

## 2015-06-18 ENCOUNTER — Encounter (HOSPITAL_BASED_OUTPATIENT_CLINIC_OR_DEPARTMENT_OTHER): Payer: Self-pay | Admitting: Emergency Medicine

## 2015-06-18 ENCOUNTER — Emergency Department (HOSPITAL_BASED_OUTPATIENT_CLINIC_OR_DEPARTMENT_OTHER)
Admission: EM | Admit: 2015-06-18 | Discharge: 2015-06-18 | Disposition: A | Discharge status: Home / Self Care | Payer: Self-pay | Attending: Emergency Medicine | Admitting: Emergency Medicine

## 2015-06-18 DIAGNOSIS — Z87828 Personal history of other (healed) physical injury and trauma: Secondary | ICD-10-CM | POA: Insufficient documentation

## 2015-06-18 DIAGNOSIS — I10 Essential (primary) hypertension: Secondary | ICD-10-CM | POA: Insufficient documentation

## 2015-06-18 DIAGNOSIS — Z720 Tobacco use: Secondary | ICD-10-CM | POA: Insufficient documentation

## 2015-06-18 LAB — BASIC METABOLIC PANEL
ANION GAP: 9 (ref 5–15)
BUN: 13 mg/dL (ref 6–20)
CO2: 29 mmol/L (ref 22–32)
Calcium: 9.1 mg/dL (ref 8.9–10.3)
Chloride: 100 mmol/L — ABNORMAL LOW (ref 101–111)
Creatinine, Ser: 1.06 mg/dL (ref 0.61–1.24)
GFR calc non Af Amer: >60 mL/min (ref >60)
Glucose, Bld: 96 mg/dL (ref 65–99)
POTASSIUM: 3.4 mmol/L — AB (ref 3.5–5.1)
SODIUM: 138 mmol/L (ref 135–145)

## 2015-06-18 MED ORDER — LISINOPRIL 10 MG PO TABS: 10.0000 mg | ORAL_TABLET | Freq: Every day | ORAL | Status: DC

## 2015-06-18 MED ORDER — LISINOPRIL 10 MG PO TABS
20.0000 mg | ORAL_TABLET | Freq: Once | ORAL | Status: AC
  Administered 2015-06-18: 20 mg via ORAL
  Filled 2015-06-18: qty 2
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
## 2015-06-18 NOTE — Discharge Instructions (Signed)
DASH Eating Plan DASH stands for "Dietary Approaches to Stop Hypertension." The DASH eating plan is a healthy eating plan that has been shown to reduce high blood pressure (hypertension). Additional health benefits may include reducing the risk of type 2 diabetes mellitus, heart disease, and stroke. The DASH eating plan may also help with weight loss. WHAT DO I NEED TO KNOW ABOUT THE DASH EATING PLAN? For the DASH eating plan, you will follow these general guidelines:  Choose foods with a percent daily value for sodium of less than 5% (as listed on the food label).  Use salt-free seasonings or herbs instead of table salt or sea salt.  Check with your health care provider or pharmacist before using salt substitutes.  Eat lower-sodium products, often labeled as "lower sodium" or "no salt added."  Eat fresh foods.  Eat more vegetables, fruits, and low-fat dairy products.  Choose whole grains. Look for the word "whole" as the first word in the ingredient list.  Choose fish and skinless chicken or turkey more often than red meat. Limit fish, poultry, and meat to 6 oz (170 g) each day.  Limit sweets, desserts, sugars, and sugary drinks.  Choose heart-healthy fats.  Limit cheese to 1 oz (28 g) per day.  Eat more home-cooked food and less restaurant, buffet, and fast food.  Limit fried foods.  Cook foods using methods other than frying.  Limit canned vegetables. If you do use them, rinse them well to decrease the sodium.  When eating at a restaurant, ask that your food be prepared with less salt, or no salt if possible. WHAT FOODS CAN I EAT? Seek help from a dietitian for individual calorie needs. Grains Whole grain or whole wheat bread. Brown rice. Whole grain or whole wheat pasta. Quinoa, bulgur, and whole grain cereals. Low-sodium cereals. Corn or whole wheat flour tortillas. Whole grain cornbread. Whole grain crackers. Low-sodium crackers. Vegetables Fresh or frozen vegetables  (raw, steamed, roasted, or grilled). Low-sodium or reduced-sodium tomato and vegetable juices. Low-sodium or reduced-sodium tomato sauce and paste. Low-sodium or reduced-sodium canned vegetables.  Fruits All fresh, canned (in natural juice), or frozen fruits. Meat and Other Protein Products Ground beef (85% or leaner), grass-fed beef, or beef trimmed of fat. Skinless chicken or turkey. Ground chicken or turkey. Pork trimmed of fat. All fish and seafood. Eggs. Dried beans, peas, or lentils. Unsalted nuts and seeds. Unsalted canned beans. Dairy Low-fat dairy products, such as skim or 1% milk, 2% or reduced-fat cheeses, low-fat ricotta or cottage cheese, or plain low-fat yogurt. Low-sodium or reduced-sodium cheeses. Fats and Oils Tub margarines without trans fats. Light or reduced-fat mayonnaise and salad dressings (reduced sodium). Avocado. Safflower, olive, or canola oils. Natural peanut or almond butter. Other Unsalted popcorn and pretzels. The items listed above may not be a complete list of recommended foods or beverages. Contact your dietitian for more options. WHAT FOODS ARE NOT RECOMMENDED? Grains White bread. White pasta. White rice. Refined cornbread. Bagels and croissants. Crackers that contain trans fat. Vegetables Creamed or fried vegetables. Vegetables in a cheese sauce. Regular canned vegetables. Regular canned tomato sauce and paste. Regular tomato and vegetable juices. Fruits Dried fruits. Canned fruit in light or heavy syrup. Fruit juice. Meat and Other Protein Products Fatty cuts of meat. Ribs, chicken wings, bacon, sausage, bologna, salami, chitterlings, fatback, hot dogs, bratwurst, and packaged luncheon meats. Salted nuts and seeds. Canned beans with salt. Dairy Whole or 2% milk, cream, half-and-half, and cream cheese. Whole-fat or sweetened yogurt. Full-fat   cheeses or blue cheese. Nondairy creamers and whipped toppings. Processed cheese, cheese spreads, or cheese  curds. Condiments Onion and garlic salt, seasoned salt, table salt, and sea salt. Canned and packaged gravies. Worcestershire sauce. Tartar sauce. Barbecue sauce. Teriyaki sauce. Soy sauce, including reduced sodium. Steak sauce. Fish sauce. Oyster sauce. Cocktail sauce. Horseradish. Ketchup and mustard. Meat flavorings and tenderizers. Bouillon cubes. Hot sauce. Tabasco sauce. Marinades. Taco seasonings. Relishes. Fats and Oils Butter, stick margarine, lard, shortening, ghee, and bacon fat. Coconut, palm kernel, or palm oils. Regular salad dressings. Other Pickles and olives. Salted popcorn and pretzels. The items listed above may not be a complete list of foods and beverages to avoid. Contact your dietitian for more information. WHERE CAN I FIND MORE INFORMATION? National Heart, Lung, and [employer] Institute: www.nhlbi.nih.gov/health/health-topics/topics/dash/ Document Released: 10/10/2011 Document Revised: 03/07/2014 Document Reviewed: 08/25/2013 ExitCare Patient Information 2015 [employer]. This information is not intended to replace advice given to you by your health care provider. Make sure you discuss any questions you have with your health care provider. Hypertension Hypertension, commonly called high blood pressure, is when the force of blood pumping through your arteries is too strong. Your arteries are the blood vessels that carry blood from your heart throughout your body. A blood pressure reading consists of a higher number over a lower number, such as 110/72. The higher number (systolic) is the pressure inside your arteries when your heart pumps. The lower number (diastolic) is the pressure inside your arteries when your heart relaxes. Ideally you want your blood pressure below 120/80. Hypertension forces your heart to work harder to pump blood. Your arteries may become narrow or stiff. Having hypertension puts you at risk for heart disease, stroke, and other problems.  RISK  FACTORS Some risk factors for high blood pressure are controllable. Others are not.  Risk factors you cannot control include:   Race. You may be at higher risk if you are African American.  Age. Risk increases with age.  Gender. Men are at higher risk than women before age 45 years. After age 65, women are at higher risk than men. Risk factors you can control include:  Not getting enough exercise or physical activity.  Being overweight.  Getting too much fat, sugar, calories, or salt in your diet.  Drinking too much alcohol. SIGNS AND SYMPTOMS Hypertension does not usually cause signs or symptoms. Extremely high blood pressure (hypertensive crisis) may cause headache, anxiety, shortness of breath, and nosebleed. DIAGNOSIS  To check if you have hypertension, your health care provider will measure your blood pressure while you are seated, with your arm held at the level of your heart. It should be measured at least twice using the same arm. Certain conditions can cause a difference in blood pressure between your right and left arms. A blood pressure reading that is higher than normal on one occasion does not mean that you need treatment. If one blood pressure reading is high, ask your health care provider about having it checked again. TREATMENT  Treating high blood pressure includes making lifestyle changes and possibly taking medicine. Living a healthy lifestyle can help lower high blood pressure. You may need to change some of your habits. Lifestyle changes may include:  Following the DASH diet. This diet is high in fruits, vegetables, and whole grains. It is low in salt, red meat, and added sugars.  Getting at least 2 hours of brisk physical activity every week.  Losing weight if necessary.  Not smoking.  Limiting   alcoholic beverages.  Learning ways to reduce stress. If lifestyle changes are not enough to get your blood pressure under control, your health care provider may  prescribe medicine. You may need to take more than one. Work closely with your health care provider to understand the risks and benefits. HOME CARE INSTRUCTIONS  Have your blood pressure rechecked as directed by your health care provider.   Take medicines only as directed by your health care provider. Follow the directions carefully. Blood pressure medicines must be taken as prescribed. The medicine does not work as well when you skip doses. Skipping doses also puts you at risk for problems.   Do not smoke.   Monitor your blood pressure at home as directed by your health care provider. SEEK MEDICAL CARE IF:   You think you are having a reaction to medicines taken.  You have recurrent headaches or feel dizzy.  You have swelling in your ankles.  You have trouble with your vision. SEEK IMMEDIATE MEDICAL CARE IF:  You develop a severe headache or confusion.  You have unusual weakness, numbness, or feel faint.  You have severe chest or abdominal pain.  You vomit repeatedly.  You have trouble breathing. MAKE SURE YOU:   Understand these instructions.  Will watch your condition.  Will get help right away if you are not doing well or get worse. Document Released: 10/21/2005 Document Revised: 03/07/2014 Document Reviewed: 08/13/2013 ExitCare Patient Information 2015 [employer]. This information is not intended to replace advice given to you by your health care provider. Make sure you discuss any questions you have with your health care provider.  

## 2015-06-18 NOTE — ED Notes (Signed)
Patient went to dentist yesterday and his BP was high. They would not do the dental work. Patient denies any s/s of Hypertension

## 2015-07-13 DIAGNOSIS — Z79899 Other long term (current) drug therapy: Secondary | ICD-10-CM | POA: Insufficient documentation

## 2015-07-13 DIAGNOSIS — Y998 Other external cause status: Secondary | ICD-10-CM | POA: Insufficient documentation

## 2015-07-13 DIAGNOSIS — Y9289 Other specified places as the place of occurrence of the external cause: Secondary | ICD-10-CM | POA: Insufficient documentation

## 2015-07-13 DIAGNOSIS — Z720 Tobacco use: Secondary | ICD-10-CM | POA: Insufficient documentation

## 2015-07-13 DIAGNOSIS — Y9389 Activity, other specified: Secondary | ICD-10-CM | POA: Insufficient documentation

## 2015-07-13 DIAGNOSIS — S82831A Other fracture of upper and lower end of right fibula, initial encounter for closed fracture: Secondary | ICD-10-CM | POA: Insufficient documentation

## 2015-07-13 DIAGNOSIS — I10 Essential (primary) hypertension: Secondary | ICD-10-CM | POA: Insufficient documentation

## 2015-07-13 DIAGNOSIS — W500XXA Accidental hit or strike by another person, initial encounter: Secondary | ICD-10-CM | POA: Insufficient documentation

## 2015-07-14 ENCOUNTER — Emergency Department (HOSPITAL_BASED_OUTPATIENT_CLINIC_OR_DEPARTMENT_OTHER)
Admission: EM | Admit: 2015-07-14 | Discharge: 2015-07-14 | Disposition: A | Discharge status: Home / Self Care | Payer: Self-pay | Attending: Emergency Medicine | Admitting: Emergency Medicine

## 2015-07-14 ENCOUNTER — Emergency Department (HOSPITAL_BASED_OUTPATIENT_CLINIC_OR_DEPARTMENT_OTHER): Payer: Self-pay

## 2015-07-14 ENCOUNTER — Encounter (HOSPITAL_BASED_OUTPATIENT_CLINIC_OR_DEPARTMENT_OTHER): Payer: Self-pay | Admitting: *Deleted

## 2015-07-14 DIAGNOSIS — S82401A Unspecified fracture of shaft of right fibula, initial encounter for closed fracture: Secondary | ICD-10-CM

## 2015-07-14 MED ORDER — HYDROCODONE-ACETAMINOPHEN 5-325 MG PO TABS: 1.0000 | ORAL_TABLET | Freq: Four times a day (QID) | ORAL | Status: DC | PRN

## 2015-07-14 NOTE — ED Notes (Signed)
C/o R ankle pain, "rolled ankle while playing around", no meds PTA. Denies sx other than pain.

## 2015-07-14 NOTE — Discharge Instructions (Signed)
Wear a Cam Walker and no weightbearing until followed up by orthopedics.  Follow-up with Dr. Xu. The contact for his Roda Shuttersoffice has been provided in this discharge summary. Call tomorrow to arrange this appointment in the next 2-3 days.  Hydrocodone as prescribed as needed for pain.  Keep your foot elevated and ice for 20 minutes every 2 hours while awake for the next 2 days to help with swelling.   Fibular Fracture, Ankle, Adult, Undisplaced, Treated With Immobilization A simple fracture of the bone below the knee on the outside of your leg (fibula) usually heals without problems. CAUSES Typically, a fibular fracture occurs as a result of trauma. A blow to the side of your leg or a powerful twisting movement can cause a fracture. Fibular fractures are often seen as a result of football, soccer, or skiing injuries. SYMPTOMS Symptoms of a fibular fracture can include:  Pain.  Shortening or abnormal alignment of your lower leg (angulation). DIAGNOSIS A health care provider will need to examine the leg. X-ray exams will be ordered for further to confirm the fracture and evaluate the extent and of the injury. TREATMENT  Typically, a cast or immobilizer is applied. Sometimes a splint is placed on these fractures if it is needed for comfort or if the bones are badly out of place. Crutches may be needed to help you get around.  HOME CARE INSTRUCTIONS   Apply ice to the injured area:  Put ice in a plastic bag.  Place a towel between your skin and the bag.  Leave the ice on for 20 minutes, 2-3 times a day.  Use crutches as directed. Resume walking without crutches as directed by your health care provider or when comfortable doing so.  Only take over-the-counter or prescription medicines for pain, discomfort, or fever as directed by your health care provider.  Keeping your leg raised may lessen swelling.  If you have a removable splint or boot, do not remove the boot unless directed by your  health care provider.  Do not not drive a car or operate a motor vehicle until your health care provider specifically tells you it is safe to do so. SEEK IMMEDIATE MEDICAL CARE IF:   Your cast gets damaged or breaks.  You have continued severe pain or more swelling than you did before the cast was put on, or the pain is not controlled with medications.  Your skin or nails below the injury turn blue or grey, or feel cold or numb.  There is a bad smell or pus coming from under the cast.  You develop severe pain in ankle or foot. MAKE SURE YOU:   Understand these instructions.  Will watch your condition.  Will get help right away if you are not doing well or get worse. Document Released: 07/13/2002 Document Revised: 08/11/2013 Document Reviewed: 06/02/2013 [facility] Patient Information 2015 [place], [place]. This information is not intended to replace advice given to you by your health care provider. Make sure you discuss any questions you have with your health care provider.

## 2015-07-14 NOTE — ED Notes (Signed)
Twisted rt ankle wednesday pm  Increased swelling and pain
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
## 2015-07-14 NOTE — ED Notes (Signed)
Sitting in w/c, c/o R foot & ankle pain with swelling and w/o bruising, rolled ankle Wednesday. family with pt, alert, NAD, calm, interactive, updated, pending xray.

## 2017-06-13 ENCOUNTER — Encounter (HOSPITAL_COMMUNITY): Payer: Self-pay | Admitting: Emergency Medicine

## 2017-06-13 ENCOUNTER — Ambulatory Visit (HOSPITAL_COMMUNITY)
Admission: EM | Admit: 2017-06-13 | Discharge: 2017-06-13 | Disposition: A | Discharge status: Home / Self Care | Payer: Self-pay | Attending: Emergency Medicine | Admitting: Emergency Medicine

## 2017-06-13 DIAGNOSIS — M62838 Other muscle spasm: Secondary | ICD-10-CM

## 2017-06-13 DIAGNOSIS — M62830 Muscle spasm of back: Secondary | ICD-10-CM

## 2017-06-13 DIAGNOSIS — I10 Essential (primary) hypertension: Secondary | ICD-10-CM

## 2017-06-13 DIAGNOSIS — Z760 Encounter for issue of repeat prescription: Secondary | ICD-10-CM

## 2017-06-13 MED ORDER — DICLOFENAC SODIUM 75 MG PO TBEC: 75.0000 mg | DELAYED_RELEASE_TABLET | Freq: Two times a day (BID) | ORAL | 0 refills | Status: DC

## 2017-06-13 MED ORDER — LISINOPRIL 10 MG PO TABS: 10.0000 mg | ORAL_TABLET | Freq: Every day | ORAL | 1 refills | Status: DC

## 2017-06-13 MED ORDER — CYCLOBENZAPRINE HCL 10 MG PO TABS: 10.0000 mg | ORAL_TABLET | Freq: Two times a day (BID) | ORAL | 0 refills | Status: DC | PRN

## 2017-06-13 NOTE — ED Triage Notes (Signed)
[facility] a [facility] right arm pain x 1 week. The patient denied any known injury.

## 2017-06-13 NOTE — ED Provider Notes (Signed)
  [facility]   [id number] 06/13/17 Arrival Time: 1001  ASSESSMENT & PLAN:  1. Medication refill   2. Muscle spasm     Meds ordered this encounter  Medications  . cyclobenzaprine (FLEXERIL) 10 MG tablet    Sig: Take 1 tablet (10 mg total) by mouth 2 (two) times daily as needed for muscle spasms.    Dispense:  20 tablet    Refill:  0    Order Specific Question:   Supervising Provider    Answer:   Mardella LaymanHAGLER, BRIAN [id number]]  . lisinopril (PRINIVIL) 10 MG tablet    Sig: Take 1 tablet (10 mg total) by mouth daily.    Dispense:  30 tablet    Refill:  1    Order Specific Question:   Supervising Provider    Answer:   Mardella LaymanHAGLER, BRIAN [id number]]  . diclofenac (VOLTAREN) 75 MG EC tablet    Sig: Take 1 tablet (75 mg total) by mouth 2 (two) times daily.    Dispense:  20 tablet    Refill:  0    Order Specific Question:   Supervising Provider    Answer:   Mardella LaymanHAGLER, BRIAN [[id number]]    Reviewed expectations re: course of current medical issues. Questions answered. Outlined signs and symptoms indicating need for more acute intervention. Patient verbalized understanding. After Visit Summary given.   SUBJECTIVE:  Mark [name] is a 31 y.o. male who presents with complaint of arm pain for a week and a half, denies traumatic cause or heavy lifting. 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 No fever or chills, numbness or tingling. Also requesting medication refill for BP medicine. Denies any cp, sob, weakness, dizziness, or headaches, etc.  ROS: As per HPI, remainder of ROS negative.   OBJECTIVE:  Vitals:   06/13/17 1020  BP: (!) 151/103  Pulse: 88  Resp: 16  Temp: 99 F (37.2 C)  TempSrc: Oral  SpO2: 97%     General appearance: alert; no distress HEENT: normocephalic; atraumatic; conjunctivae normal;  Neck: no step offs or defformities Back: no CVA tenderness, muscle spasm noted right trapezius Extremities: no cyanosis or edema; symmetrical with no gross  deformities, sensory function intact, cap refill less than 2 seconds Skin: warm and dry Neurologic: Grossly normal Psychological:  alert and cooperative; normal mood and affect  Procedures:     Labs Reviewed - No data to display  No results found.  No Known Allergies  PMHx, SurgHx, SocialHx, Medications, and Allergies were reviewed in the Visit Navigator and updated as appropriate.       Dorena BodoKennard, Autry Prust, NP 06/13/17 1045

## 2017-06-13 NOTE — Discharge Instructions (Signed)
You most likely have muscle spasm in your shoulder. I have prescribed two medicines for your pain. The first is diclofenac, take 1 tablet twice a day and the other is Flexeril, take 1 tablet twice a day. Flexeril may cause drowsiness so do not drive until you know how this medicine affects you. Also do not drink any alcohol either. You may apply ice and alternate with heat for 15 minutes at a time 4 times daily and for additional pain control you may take tylenol over the counter ever 4 hours but do not take more than 4000 mg a day. Should your pain continue or fail to resolve, follow up with your primary care provider or return to clinic as needed.   I have attached the contact information for [facility], contact them to schedule an appointment to establish for primary care.

## 2017-11-11 ENCOUNTER — Encounter (HOSPITAL_COMMUNITY): Payer: Self-pay | Admitting: Family Medicine

## 2017-11-11 ENCOUNTER — Ambulatory Visit (HOSPITAL_COMMUNITY)
Admission: EM | Admit: 2017-11-11 | Discharge: 2017-11-11 | Disposition: A | Discharge status: Home / Self Care | Payer: Self-pay | Attending: Nurse Practitioner | Admitting: Nurse Practitioner

## 2017-11-11 DIAGNOSIS — I10 Essential (primary) hypertension: Secondary | ICD-10-CM

## 2017-11-11 DIAGNOSIS — Z760 Encounter for issue of repeat prescription: Secondary | ICD-10-CM

## 2017-11-11 MED ORDER — LISINOPRIL 10 MG PO TABS: 10.0000 mg | ORAL_TABLET | Freq: Every day | ORAL | 1 refills | Status: DC
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
## 2017-11-11 NOTE — ED Triage Notes (Signed)
Pt here for medication refill.  Hasnt had BP meds in months.

## 2018-04-15 ENCOUNTER — Other Ambulatory Visit: Payer: Self-pay

## 2018-04-15 ENCOUNTER — Ambulatory Visit (HOSPITAL_COMMUNITY)
Admission: EM | Admit: 2018-04-15 | Discharge: 2018-04-15 | Disposition: A | Discharge status: Home / Self Care | Payer: Self-pay | Attending: Family Medicine | Admitting: Family Medicine

## 2018-04-15 ENCOUNTER — Encounter (HOSPITAL_COMMUNITY): Payer: Self-pay | Admitting: Emergency Medicine

## 2018-04-15 DIAGNOSIS — M791 Myalgia, unspecified site: Secondary | ICD-10-CM

## 2018-04-15 MED ORDER — CYCLOBENZAPRINE HCL 10 MG PO TABS: 10.0000 mg | ORAL_TABLET | Freq: Two times a day (BID) | ORAL | 2 refills | Status: AC | PRN
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
## 2018-04-15 NOTE — ED Triage Notes (Signed)
Pt here for muscle spasms to right shoulder.  Pt states this is has happened to him before and we prescribed Flexeril and Voltaren and that helped him.

## 2018-08-05 ENCOUNTER — Ambulatory Visit (HOSPITAL_COMMUNITY)
Admission: EM | Admit: 2018-08-05 | Discharge: 2018-08-05 | Disposition: A | Discharge status: Home / Self Care | Payer: Self-pay | Attending: Family Medicine | Admitting: Family Medicine

## 2018-08-05 ENCOUNTER — Encounter (HOSPITAL_COMMUNITY): Payer: Self-pay

## 2018-08-05 DIAGNOSIS — I10 Essential (primary) hypertension: Secondary | ICD-10-CM

## 2018-08-05 LAB — POCT I-STAT, CHEM 8
BUN: 14 mg/dL (ref 6–20)
CALCIUM ION: 1.19 mmol/L (ref 1.15–1.40)
CHLORIDE: 103 mmol/L (ref 98–111)
Creatinine, Ser: 1 mg/dL (ref 0.61–1.24)
GLUCOSE: 95 mg/dL (ref 70–99)
HCT: 49 % (ref 39.0–52.0)
Hemoglobin: 16.7 g/dL (ref 13.0–17.0)
POTASSIUM: 4 mmol/L (ref 3.5–5.1)
Sodium: 140 mmol/L (ref 135–145)
TCO2: 27 mmol/L (ref 22–32)

## 2018-08-05 MED ORDER — LISINOPRIL 20 MG PO TABS: 20.0000 mg | ORAL_TABLET | Freq: Every day | ORAL | 2 refills | Status: DC
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
## 2018-08-05 NOTE — ED Triage Notes (Signed)
Pt presents for refill on blood pressure medication.  Pt presents with elevated blood pressure.

## 2018-08-05 NOTE — Discharge Instructions (Addendum)
Please do your best to find a primary care provider to follow your hypertension.

## 2018-08-08 ENCOUNTER — Encounter (HOSPITAL_COMMUNITY): Payer: Self-pay | Admitting: Emergency Medicine

## 2018-08-08 ENCOUNTER — Ambulatory Visit (HOSPITAL_COMMUNITY)
Admission: EM | Admit: 2018-08-08 | Discharge: 2018-08-08 | Disposition: A | Discharge status: Home / Self Care | Payer: Self-pay | Attending: Family Medicine | Admitting: Family Medicine

## 2018-08-08 DIAGNOSIS — S46811D Strain of other muscles, fascia and tendons at shoulder and upper arm level, right arm, subsequent encounter: Secondary | ICD-10-CM

## 2018-08-08 DIAGNOSIS — M62838 Other muscle spasm: Secondary | ICD-10-CM

## 2018-08-08 MED ORDER — CYCLOBENZAPRINE HCL 10 MG PO TABS: 10.0000 mg | ORAL_TABLET | Freq: Two times a day (BID) | ORAL | 0 refills | Status: DC | PRN

## 2018-08-08 MED ORDER — DICLOFENAC SODIUM 75 MG PO TBEC: 75.0000 mg | DELAYED_RELEASE_TABLET | Freq: Two times a day (BID) | ORAL | 0 refills | Status: DC
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
## 2018-08-08 NOTE — Discharge Instructions (Signed)
Use anti-inflammatories for pain/swelling. You may take up to 800 mg Ibuprofen every 8 hours with food. You may supplement Ibuprofen with Tylenol [phone] mg every 8 hours. OR Diclofenac twice daily with food  You may use flexeril as needed to help with pain. This is a muscle relaxer and causes sedation- please use only at bedtime or when you will be home and not have to drive/work- take 1/2 tablet if causing too much drowsiness  Alternate ice and heat- ice will help with swelling/inflammation in muscle, where heat can help loosen up the tight muscle  Follow up if symptoms worsening or not improving with treatment above, developing weakness

## 2018-08-08 NOTE — ED Triage Notes (Signed)
Pt sts right arm pain from muscle spasm x 3 days

## 2021-11-28 ENCOUNTER — Ambulatory Visit (INDEPENDENT_AMBULATORY_CARE_PROVIDER_SITE_OTHER): Payer: Self-pay

## 2021-11-28 ENCOUNTER — Other Ambulatory Visit: Payer: Self-pay

## 2021-11-28 ENCOUNTER — Ambulatory Visit (HOSPITAL_COMMUNITY)
Admission: EM | Admit: 2021-11-28 | Discharge: 2021-11-28 | Disposition: A | Discharge status: Home / Self Care | Payer: Self-pay | Attending: Internal Medicine | Admitting: Internal Medicine

## 2021-11-28 ENCOUNTER — Encounter (HOSPITAL_COMMUNITY): Payer: Self-pay | Admitting: Emergency Medicine

## 2021-11-28 DIAGNOSIS — M545 Low back pain, unspecified: Secondary | ICD-10-CM

## 2021-11-28 DIAGNOSIS — S161XXA Strain of muscle, fascia and tendon at neck level, initial encounter: Secondary | ICD-10-CM

## 2021-11-28 DIAGNOSIS — M542 Cervicalgia: Secondary | ICD-10-CM

## 2021-11-28 MED ORDER — CYCLOBENZAPRINE HCL 10 MG PO TABS: 10.0000 mg | ORAL_TABLET | Freq: Every evening | ORAL | 0 refills | Status: DC | PRN

## 2021-11-28 MED ORDER — MELOXICAM 15 MG PO TABS: 15.0000 mg | ORAL_TABLET | Freq: Every day | ORAL | 0 refills | Status: DC
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
## 2022-08-13 ENCOUNTER — Ambulatory Visit (INDEPENDENT_AMBULATORY_CARE_PROVIDER_SITE_OTHER): Payer: Self-pay

## 2022-08-13 ENCOUNTER — Encounter (HOSPITAL_COMMUNITY): Payer: Self-pay | Admitting: Emergency Medicine

## 2022-08-13 ENCOUNTER — Ambulatory Visit (HOSPITAL_COMMUNITY)
Admission: EM | Admit: 2022-08-13 | Discharge: 2022-08-13 | Disposition: A | Discharge status: Home / Self Care | Payer: Self-pay | Attending: Internal Medicine | Admitting: Internal Medicine

## 2022-08-13 DIAGNOSIS — L089 Local infection of the skin and subcutaneous tissue, unspecified: Secondary | ICD-10-CM | POA: Insufficient documentation

## 2022-08-13 DIAGNOSIS — M79672 Pain in left foot: Secondary | ICD-10-CM

## 2022-08-13 DIAGNOSIS — I10 Essential (primary) hypertension: Secondary | ICD-10-CM | POA: Insufficient documentation

## 2022-08-13 LAB — CBC
HCT: 45.4 % (ref 39.0–52.0)
Hemoglobin: 15.5 g/dL (ref 13.0–17.0)
MCH: 29.7 pg (ref 26.0–34.0)
MCHC: 34.1 g/dL (ref 30.0–36.0)
MCV: 87 fL (ref 80.0–100.0)
Platelets: 240 10*3/uL (ref 150–400)
RBC: 5.22 MIL/uL (ref 4.22–5.81)
RDW: 13.8 % (ref 11.5–15.5)
WBC: 6.3 10*3/uL (ref 4.0–10.5)
nRBC: 0 % (ref 0.0–0.2)

## 2022-08-13 LAB — BASIC METABOLIC PANEL
Anion gap: 9 (ref 5–15)
BUN: 13 mg/dL (ref 6–20)
CO2: 30 mmol/L (ref 22–32)
Calcium: 9.5 mg/dL (ref 8.9–10.3)
Chloride: 101 mmol/L (ref 98–111)
Creatinine, Ser: 1.17 mg/dL (ref 0.61–1.24)
GFR, Estimated: >60 mL/min (ref >60)
Glucose, Bld: 85 mg/dL (ref 70–99)
Potassium: 4.4 mmol/L (ref 3.5–5.1)
Sodium: 140 mmol/L (ref 135–145)

## 2022-08-13 LAB — CBG MONITORING, ED: Glucose-Capillary: 93 mg/dL (ref 70–99)

## 2022-08-13 MED ORDER — IBUPROFEN 800 MG PO TABS: 800.0000 mg | ORAL_TABLET | Freq: Three times a day (TID) | ORAL | 0 refills | Status: AC

## 2022-08-13 MED ORDER — MUPIROCIN 2 % EX OINT: 1.0000 | TOPICAL_OINTMENT | Freq: Two times a day (BID) | CUTANEOUS | 0 refills | Status: AC

## 2022-08-13 MED ORDER — IBUPROFEN 800 MG PO TABS
800.0000 mg | ORAL_TABLET | Freq: Once | ORAL | Status: AC
  Administered 2022-08-13: 800 mg via ORAL

## 2022-08-13 MED ORDER — CLINDAMYCIN HCL 300 MG PO CAPS: 300.0000 mg | ORAL_CAPSULE | Freq: Three times a day (TID) | ORAL | 0 refills | Status: AC

## 2022-08-13 MED ORDER — IBUPROFEN 800 MG PO TABS
ORAL_TABLET | ORAL | Status: AC
  Filled 2022-08-13: qty 1

## 2022-08-13 MED ORDER — AMLODIPINE BESYLATE 5 MG PO TABS: 5.0000 mg | ORAL_TABLET | Freq: Every day | ORAL | 1 refills | Status: DC

## 2022-08-13 NOTE — Discharge Instructions (Addendum)
You were seen in urgent care today for your left foot infection.  Start taking clindamycin antibiotic 3 times daily (with breakfast, lunch, and dinner) for the next 7 days.  Apply mupirocin ointment to the left foot twice daily for the next 7 to 10 days and change the bandage twice daily.  Wear the Ace wrap and elevate your left foot over the next few days to reduce the swelling.  Take ibuprofen 800 mg every 8 hours as needed for pain.  Begin to take amlodipine blood pressure medicine once daily to help with your blood pressure. Attempt to reduce the amount of salt in your diet and increase your exercise. Purchase a blood pressure cuff and take your blood pressure 2-3 times a week.  Write these numbers down in a notebook and bring them to your new primary care appointment once we are able to get you in with a PCP.  Someone from [facility] health will be reaching out to you to try to help you find a primary care provider in the next week or 2.  I have placed referrals to podiatry (foot doctor) as well as a wound care clinic for you follow-up with to ensure that your foot infection is healing appropriately.  Your x-rays are normal today.   We will call you in the next 1 to 2 days with the results of your blood work only if it changes our treatment plan or if there is anything abnormal.  You will not hear from [place] if your blood work is normal.  If you develop any new or worsening symptoms or do not improve in the next 2 to 3 days, please return.  If your symptoms are severe, please go to the emergency room.  Follow-up with your primary care provider for further evaluation and management of your symptoms as well as ongoing wellness visits.  I hope you feel better!

## 2022-08-13 NOTE — ED Triage Notes (Signed)
Pt reports he thought had something like athletes foot then believes now turned in to an infection. Has pain, swelling and some drainage for about week.
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
## 2022-08-14 NOTE — Progress Notes (Addendum)
ARIANNA, DELSANTO ([id number]) [id number] Page 1 of 9 Visit Report for 08/15/2022 Allergy List Details Patient Name: Date of Service: IVOR, KISHI. 08/15/2022 2:30 PM Medical Record Number: [id number] Patient Account Number: [id number] Date of Birth/Sex: Treating RN: 1986/01/08 (36 y.o. Lytle Michaels Primary Care Reylynn Vanalstine: PCP, NO Other Clinician: Referring Azoria Abbett: Treating Marwan Lipe/Extender: Trixie Rude NHO PE, [place] THA RINE Weeks in Treatment: 0 Allergies Active Allergies No Known Allergies Allergy Notes Electronic Signature(s) Signed: 08/15/2022 4:24:49 PM By: Antonieta Iba Previous Signature: 08/14/2022 5:25:56 PM Version By: Antonieta Iba Entered By: Antonieta Iba on 08/15/2022 14:51:57 -------------------------------------------------------------------------------- Arrival Information Details Patient Name: Date of Service: [name]. 08/15/2022 2:30 PM Medical Record Number: [id number] Patient Account Number: [id number] Date of Birth/Sex: Treating RN: 22-Apr-1986 (36 y.o. Lytle Michaels Primary Care Jayce Kainz: PCP, NO Other Clinician: Referring Janique Hoefer: Treating Makenzye Troutman/Extender: Trixie Rude NHO PE, CA THA RINE Weeks in Treatment: 0 Visit Information Patient Arrived: Ambulatory Arrival Time: 14:45 Transfer Assistance: None Patient Identification Verified: Yes Secondary Verification Process Completed: Yes Patient Requires Transmission-Based Precautions: No Patient Has Alerts: No Electronic Signature(s) Signed: 08/15/2022 4:24:49 PM By: Antonieta Iba Entered By: Antonieta Iba on 08/15/2022 14:50:52 -------------------------------------------------------------------------------- Clinic Level of Care Assessment Details Patient Name: Date of Service: ANDRON, MARRAZZO 08/15/2022 2:30 PM Medical Record Number: [id number] Patient Account Number: [id number] DAYLE, MCNERNEY ([id number])  [phone].pdf Page 2 of 9 Date of Birth/Sex: Treating RN: 1986/01/03 (36 y.o. Lytle Michaels Primary Care Kent Riendeau: Other Clinician: PCP, NO Referring Asim Gersten: Treating Daytona Hedman/Extender: Trixie Rude NHO PE, CA THA RINE Weeks in Treatment: 0 Clinic Level of Care Assessment Items TOOL 1 Quantity Score X- 1 0 Use when EandM and Procedure is performed on INITIAL visit ASSESSMENTS - Nursing Assessment / Reassessment X- 1 20 General Physical Exam (combine w/ comprehensive assessment (listed just below) when performed on new pt. evals) X- 1 25 Comprehensive Assessment (HX, ROS, Risk Assessments, Wounds Hx, etc.) ASSESSMENTS - Wound and Skin Assessment / Reassessment []  - 0 Dermatologic / Skin Assessment (not related to wound area) ASSESSMENTS - Ostomy and/or Continence Assessment and Care []  - 0 Incontinence Assessment and Management []  - 0 Ostomy Care Assessment and Management (repouching, etc.) PROCESS - Coordination of Care []  - 0 Simple Patient / Family Education for ongoing care X- 1 20 Complex (extensive) Patient / Family Education for ongoing care X- 1 10 Staff obtains , Records, T Results / Process Orders est []  - 0 Staff telephones HHA, Nursing Homes / Clarify orders / etc []  - 0 Routine Transfer to another Facility (non-emergent condition) []  - 0 Routine Hospital Admission (non-emergent condition) []  - 0 New Admissions / / Ordering NPWT Apligraf, etc. , []  - 0 Emergency Hospital Admission (emergent condition) PROCESS - Special Needs []  - 0 Pediatric / Minor Patient Management []  - 0 Isolation Patient Management []  - 0 Hearing / Language / Visual special needs []  - 0 Assessment of Community assistance (transportation, D/C planning, etc.) []  - 0 Additional assistance / Altered mentation []  - 0 Support Surface(s) Assessment (bed, cushion, seat, etc.) INTERVENTIONS - Miscellaneous []  -  0 External ear exam []  - 0 Patient Transfer (multiple staff / / Similar devices) []  - 0 Simple Staple / Suture removal (25 or less) []  - 0 Complex Staple / Suture removal (26 or more) []  - 0 Hypo/Hyperglycemic Management (do not check if billed separately) X- 1 15 Ankle / Brachial Index (ABI) - do not  check if billed separately Has the patient been seen at the hospital within the last three years: Yes Total Score: 90 Level Of Care: New/Established - Level 3 Electronic Signature(s) Signed: 08/15/2022 4:24:49 PM By: Lorrin Jackson Entered By: Lorrin Jackson on 08/15/2022 15:31:47 Lysle Pearl ([id number]) [id number] Page 3 of 9 -------------------------------------------------------------------------------- Encounter Discharge Information Details Patient Name: Date of Service: NICHOLSON, STARACE 08/15/2022 2:30 PM Medical Record Number: [id number] Patient Account Number: [id number] Date of Birth/Sex: Treating RN: [birth year] (36 y.o. Marcheta Grammes Primary Care Alexia Dinger: PCP, NO Other Clinician: Referring Oluwatosin Bracy: Treating Faatima Tench/Extender: Sindy Messing NHO PE, CA THA RINE Weeks in Treatment: 0 Encounter Discharge Information Items Post Procedure Vitals Discharge Condition: Stable Temperature (F): 98.5 Ambulatory Status: Ambulatory Pulse (bpm): 97 Discharge Destination: Home Respiratory Rate (breaths/min): 18 Transportation: Private Auto Blood Pressure (mmHg): 172/100 Schedule Follow-up Appointment: Yes Clinical Summary of Care: Provided on 08/15/2022 Form Type Recipient Paper Patient Patient Electronic Signature(s) Signed: 08/15/2022 3:50:32 PM By: Lorrin Jackson Entered By: Lorrin Jackson on 08/15/2022 15:50:31 -------------------------------------------------------------------------------- Lower Extremity Assessment Details Patient Name: Date of Service: Governor Rooks. 08/15/2022 2:30 PM Medical Record Number:  [id number] Patient Account Number: [id number] Date of Birth/Sex: Treating RN: 09/05/1986 (36 y.o. Marcheta Grammes Primary Care Micaiah Litle: PCP, NO Other Clinician: Referring Talishia Betzler: Treating Kohen Reither/Extender: Sindy Messing NHO PE, CA THA RINE Weeks in Treatment: 0 Edema Assessment Assessed: [Left: Yes] [Right: No] Edema: [Left: N] [Right: o] Calf Left: Right: Point of Measurement: 36 cm From Medial Instep 38.6 cm Ankle Left: Right: Point of Measurement: 8 cm From Medial Instep 23.8 cm Knee To Floor Left: Right: From Medial Instep 44 cm Vascular Assessment Pulses: Dorsalis Pedis Palpable: [Left:Yes] Doppler Audible: [Left:Yes] Blood Pressure: Brachial: [Left:172] Ankle: [Left:Dorsalis Pedis: 220 1.28] Electronic Signature(s) Signed: 08/15/2022 4:24:49 PM By: Lorrin Jackson Entered By: Lorrin Jackson on 08/15/2022 15:06:12 Lysle Pearl ([id number]) [id number] Page 4 of 9 -------------------------------------------------------------------------------- Multi Wound Chart Details Patient Name: Date of Service: RENDER, MARLEY. 08/15/2022 2:30 PM Medical Record Number: [id number] Patient Account Number: [id number] Date of Birth/Sex: Treating RN: [birth year] (37 y.o. M) Primary Care Larry Knipp: PCP, NO Other Clinician: Referring Maleiyah Releford: Treating Wally Shevchenko/Extender: Sindy Messing NHO PE, CA THA RINE Weeks in Treatment: 0 Vital Signs Height(in): 71 Pulse(bpm): 97 Weight(lbs): 195 Blood Pressure(mmHg): 172/100 Body Mass Index(BMI): 27.2 Temperature(F): 98.5 Respiratory Rate(breaths/min): 18 [1:Photos:] [N/A:N/A] Right Foot N/A N/A Wound Location: Trauma N/A N/A Wounding Event: Infection - not elsewhere classified N/A N/A Primary Etiology: Hypertension N/A N/A Comorbid History: 07/31/2022 N/A N/A Date Acquired: 0 N/A N/A Weeks of Treatment: Open N/A N/A Wound Status: No N/A N/A Wound Recurrence: 6.7x7x0.1 N/A  N/A Measurements L x W x D (cm) 36.835 N/A N/A A (cm) : rea 3.684 N/A N/A Volume (cm) : Full Thickness Without Exposed N/A N/A Classification: Support Structures Medium N/A N/A Exudate A mount: Purulent N/A N/A Exudate Type: yellow, brown, green N/A N/A Exudate Color: Distinct, outline attached N/A N/A Wound Margin: Medium (34-66%) N/A N/A Granulation A mount: Red N/A N/A Granulation Quality: Medium (34-66%) N/A N/A Necrotic A mount: Fat Layer (Subcutaneous Tissue): Yes N/A N/A Exposed Structures: Fascia: No Tendon: No Muscle: No Joint: No Bone: No None N/A N/A Epithelialization: Debridement - Selective/Open Wound N/A N/A Debridement: Pre-procedure Verification/Time Out 15:19 N/A N/A Taken: Lidocaine 4% Topical Solution N/A N/A Pain Control: Slough N/A N/A Tissue Debrided: Skin/Dermis N/A N/A Level: 46.9 N/A N/A Debridement A (sq cm): MERLIN, GOLDEN ([id number]) [id number] Page  5 of 9 Curette N/A N/A Instrument: Swab N/A N/A Specimen: 1 N/A N/A Number of Specimens Taken: Minimum N/A N/A Bleeding: Pressure N/A N/A Hemostasis Achieved: Procedure was tolerated well N/A N/A Debridement Treatment Response: 6.7x7x0.1 N/A N/A Post Debridement Measurements L x W x D (cm) 3.684 N/A N/A Post Debridement Volume: (cm) Excoriation: No N/A N/A Periwound Skin Texture: Induration: No Callus: No Crepitus: No Rash: No Scarring: No Maceration: No N/A N/A Periwound Skin Moisture: Dry/Scaly: No Atrophie Blanche: No N/A N/A Periwound Skin Color: Cyanosis: No Ecchymosis: No Erythema: No Hemosiderin Staining: No Mottled: No Pallor: No Rubor: No No Abnormality N/A N/A Temperature: Debridement N/A N/A Procedures Performed: Treatment Notes Electronic Signature(s) Signed: 08/15/2022 4:46:31 PM By: Geralyn Corwin DO Entered By: Geralyn Corwin on 08/15/2022  15:39:10 -------------------------------------------------------------------------------- Multi-Disciplinary Care Plan Details Patient Name: Date of Service: [name]. 08/15/2022 2:30 PM Medical Record Number: [id number] Patient Account Number: [id number] Date of Birth/Sex: Treating RN: [birth year] (36 y.o. Lytle Michaels Primary Care Glenis Musolf: PCP, NO Other Clinician: Referring Aislin Onofre: Treating Ethelreda Sukhu/Extender: Trixie Rude NHO PE, CA THA RINE Weeks in Treatment: 0 Active Inactive Wound/Skin Impairment Nursing Diagnoses: Impaired tissue integrity Goals: Patient/caregiver will verbalize understanding of skin care regimen Date Initiated: 08/15/2022 Target Resolution Date: 09/12/2022 Goal Status: Active Ulcer/skin breakdown will have a volume reduction of 30% by week 4 Date Initiated: 08/15/2022 Target Resolution Date: 09/12/2022 Goal Status: Active Interventions: Assess patient/caregiver ability to obtain necessary supplies Assess patient/caregiver ability to perform ulcer/skin care regimen upon admission and as needed Assess ulceration(s) every visit Provide education on ulcer and skin care Treatment Activities: Topical wound management initiated : 08/15/2022 Notes: MAKSYM, PFIFFNER ([id number]) [phone].pdf Page 6 of 9 Electronic Signature(s) Signed: 08/15/2022 2:33:42 PM By: Antonieta Iba Entered By: Antonieta Iba on 08/15/2022 14:33:42 -------------------------------------------------------------------------------- Pain Assessment Details Patient Name: Date of Service: [name] 08/15/2022 2:30 PM Medical Record Number: [id number] Patient Account Number: [id number] Date of Birth/Sex: Treating RN: April 20, 1986 (36 y.o. Lytle Michaels Primary Care Garo Heidelberg: PCP, NO Other Clinician: Referring Joely Losier: Treating Camillia Marcy/Extender: Trixie Rude NHO PE, CA THA RINE Weeks in Treatment: 0 Active  Problems Location of Pain Severity and Description of Pain Patient Has Paino No Site Locations Pain Management and Medication Current Pain Management: Electronic Signature(s) Signed: 08/15/2022 4:24:49 PM By: Antonieta Iba Entered By: Antonieta Iba on 08/15/2022 15:16:51 -------------------------------------------------------------------------------- Patient/Caregiver Education Details Patient Name: Date of Service: [name] 10/12/2023andnbsp2:30 PM Medical Record Number: [id number] Patient Account Number: [id number] Date of Birth/Gender: Treating RN: 1985-12-09 (36 y.o. Lytle Michaels Primary Care Physician: PCP, NO Other Clinician: Referring Physician: Treating Physician/Extender: Trixie Rude NHO PE, CA THA RINE Weeks in Treatment: 0 Education Assessment Education Provided ToJADARIOUS, DOBBINS ([id number]) [id number] Page 7 of 9 Patient Education Topics Provided Infection: Methods: Explain/Verbal, Printed Responses: State content correctly Wound/Skin Impairment: Methods: Demonstration, Explain/Verbal, Printed Responses: State content correctly Electronic Signature(s) Signed: 08/15/2022 4:24:49 PM By: Antonieta Iba Entered By: Antonieta Iba on 08/15/2022 15:28:52 -------------------------------------------------------------------------------- Wound Assessment Details Patient Name: Date of Service: [name]. 08/15/2022 2:30 PM Medical Record Number: [id number] Patient Account Number: [id number] Date of Birth/Sex: Treating RN: 12-17-1985 (36 y.o. Lytle Michaels Primary Care Theresea Trautmann: PCP, NO Other Clinician: Referring Jenesis Martin: Treating Yarnell Kozloski/Extender: Trixie Rude NHO PE, CA THA RINE Weeks in Treatment: 0 Wound Status Wound Number: 1 Primary Etiology: Infection - not elsewhere classified Wound Location: Right Foot Wound Status: Open Wounding Event: Trauma Comorbid History: Hypertension Date  Acquired: 07/31/2022 Weeks Of Treatment: 0 Clustered Wound: No Photos Wound Measurements Length: (cm) 6.7 Width: (cm) 7 Depth: (cm) 0.1 Area: (cm) 36.835 Volume: (cm) 3.684 % Reduction in Area: % Reduction in Volume: Epithelialization: None Tunneling: No Undermining: No Wound Description Classification: Full Thickness Without Exposed Support Structures Wound Margin: Distinct, outline attached Exudate Amount: Medium Exudate Type: Purulent Exudate Color: yellow, brown, green Foul Odor After Cleansing: No Slough/Fibrino Yes Wound Bed Granulation Amount: Medium (34-66%) Exposed Structure Granulation Quality: Red Fascia Exposed: No Necrotic Amount: Medium (34-66%) Fat Layer (Subcutaneous Tissue) Exposed: Yes ANTONEY, BIVEN ([id number]) [id number] Page 8 of 9 Necrotic Quality: Adherent Slough Tendon Exposed: No Muscle Exposed: No Joint Exposed: No Bone Exposed: No Periwound Skin Texture Texture Color No Abnormalities Noted: Yes No Abnormalities Noted: No Atrophie Blanche: No Moisture Cyanosis: No No Abnormalities Noted: No Ecchymosis: No Dry / Scaly: No Erythema: No Maceration: No Hemosiderin Staining: No Mottled: No Pallor: No Rubor: No Temperature / Pain Temperature: No Abnormality Treatment Notes Wound #1 (Foot) Wound Laterality: Right Cleanser Soap and Water Discharge Instruction: May shower and wash wound with dial antibacterial soap and water prior to dressing change. Peri-Wound Care Topical Mupirocin Ointment Discharge Instruction: Apply Mupirocin (Bactroban) as instructed Lamisil 1% Antifungal Spray Primary Dressing Secondary Dressing Woven Gauze Sponge, Non-Sterile 4x4 in Discharge Instruction: Apply over primary dressing as directed. Secured With Conforming Stretch Gauze Bandage, Sterile 2x75 (in/in) Discharge Instruction: Secure with stretch gauze as directed. Paper Tape, 2x10 (in/yd) Discharge Instruction:  Secure dressing with tape as directed. Compression Wrap Compression Stockings Add-Ons Electronic Signature(s) Signed: 08/15/2022 4:24:49 PM By: Antonieta Iba Entered By: Antonieta Iba on 08/15/2022 15:13:11 -------------------------------------------------------------------------------- Vitals Details Patient Name: Date of Service: [name]. 08/15/2022 2:30 PM Medical Record Number: [id number] Patient Account Number: [id number] Date of Birth/Sex: Treating RN: [birth year] (36 y.o. Lytle Michaels Primary Care Marygrace Sandoval: PCP, NO Other Clinician: Referring Keyarra Rendall: Treating Field Staniszewski/Extender: Trixie Rude NHO PE, [place] THA RINE Weeks in Treatment: 0 Vital Signs Time Taken: 14:50 Temperature (F): 98.5 Gair, Amair M ([id number]) [id number] Page 9 of 9 Height (in): 71 Pulse (bpm): 97 Source: Stated Respiratory Rate (breaths/min): 18 Weight (lbs): 195 Blood Pressure (mmHg): 172/100 Source: Stated Reference Range: 80 - 120 mg / dl Body Mass Index (BMI): 27.2 Electronic Signature(s) Signed: 08/15/2022 4:24:49 PM By: Antonieta Iba Entered By: Antonieta Iba on 08/15/2022 14:51:23

## 2022-08-15 ENCOUNTER — Encounter (HOSPITAL_BASED_OUTPATIENT_CLINIC_OR_DEPARTMENT_OTHER): Payer: Self-pay | Attending: Internal Medicine | Admitting: Internal Medicine

## 2022-08-15 DIAGNOSIS — S91301A Unspecified open wound, right foot, initial encounter: Secondary | ICD-10-CM

## 2022-08-15 DIAGNOSIS — I10 Essential (primary) hypertension: Secondary | ICD-10-CM

## 2022-08-15 DIAGNOSIS — X58XXXA Exposure to other specified factors, initial encounter: Secondary | ICD-10-CM | POA: Insufficient documentation

## 2022-08-15 DIAGNOSIS — L089 Local infection of the skin and subcutaneous tissue, unspecified: Secondary | ICD-10-CM

## 2022-08-15 NOTE — Progress Notes (Signed)
KARSTEN, HOWRY ([id number]) [id number] Page 1 of 8 Visit Report for 08/15/2022 Chief Complaint Document Details Patient Name: Date of Service: Mark [name], Mark [name]. 08/15/2022 2:30 PM Medical Record Number: [id number] Patient Account Number: [id number] Date of Birth/Sex: Treating RN: 11-Aug-1986 (36 y.o. [name]) Primary Care Provider: PCP, NO Other Clinician: Referring Provider: Treating Provider/Extender: Trixie Rude NHO PE, [place] THA RINE Weeks in Treatment: 0 Information Obtained from: Patient Chief Complaint 08/15/2022; right foot wounds Electronic Signature(s) Signed: 08/15/2022 4:46:31 PM By: Geralyn Corwin DO Entered By: Geralyn Corwin on 08/15/2022 15:40:04 -------------------------------------------------------------------------------- Debridement Details Patient Name: Date of Service: Mark [name]. 08/15/2022 2:30 PM Medical Record Number: [id number] Patient Account Number: [id number] Date of Birth/Sex: Treating RN: Apr 12, 1986 (36 y.o. Lytle Michaels Primary Care Provider: PCP, NO Other Clinician: Referring Provider: Treating Provider/Extender: Trixie Rude NHO PE, [place] THA RINE Weeks in Treatment: 0 Debridement Performed for Assessment: Wound #1 Right Foot Performed By: Physician Geralyn Corwin, DO Debridement Type: Debridement Level of Consciousness (Pre-procedure): Awake and Alert Pre-procedure Verification/Time Out Yes - 15:19 Taken: Start Time: 15:20 Pain Control: Lidocaine 4% T opical Solution T Area Debrided (L x W): otal 6.7 (cm) x 7 (cm) = 46.9 (cm) Tissue and other material debrided: Slough, Skin: Dermis , Slough Level: Skin/Dermis Debridement Description: Selective/Open Wound Instrument: Curette Specimen: Swab, Number of Specimens T aken: 1 Bleeding: Minimum Hemostasis Achieved: Pressure End Time: 15:25 Response to Treatment: Procedure was tolerated well Level of Consciousness (Post- Awake and  Alert procedure): Post Debridement Measurements of Total Wound Length: (cm) 6.7 Width: (cm) 7 Depth: (cm) 0.1 Volume: (cm) 3.684 Character of Wound/Ulcer Post Debridement: Stable Post Procedure Diagnosis Mark [name], Mark [name] ([id number]) [id number] Page 2 of 8 Same as Pre-procedure Notes Procdedure scribed for Dr. [name] by Gardiner Fanti, RN Electronic Signature(s) Signed: 08/15/2022 4:24:49 PM By: Antonieta Iba Signed: 08/15/2022 4:46:31 PM By: Geralyn Corwin DO Entered By: Antonieta Iba on 08/15/2022 15:31:14 -------------------------------------------------------------------------------- HPI Details Patient Name: Date of Service: Mark [name]. 08/15/2022 2:30 PM Medical Record Number: [id number] Patient Account Number: [id number] Date of Birth/Sex: Treating RN: Jul 07, 1986 (36 y.o. [name]) Primary Care Provider: PCP, NO Other Clinician: Referring Provider: Treating Provider/Extender: Trixie Rude NHO PE, [place] THA RINE Weeks in Treatment: 0 History of Present Illness HPI Description: Admission 08/15/2022 Mark [name] is a 36 year old male with essential hypertension that presents to the clinic for a 1-1/2-week history of right foot wounds. He states that there was some breakdown to his foot after scratching it and then he wore his steel toed shoes and started developing odor and breakdown of the skin. He went to the ED for this issue on 08/13/2022. He was given oral clindamycin and mupirocin ointment. He stated he started using this in the past 1 to 2 days. He currently denies systemic signs of infection. No history of diabetes. Electronic Signature(s) Signed: 08/15/2022 4:46:31 PM By: Geralyn Corwin DO Entered By: Geralyn Corwin on 08/15/2022 15:57:03 -------------------------------------------------------------------------------- Physical Exam Details Patient Name: Date of Service: Mark [name]. 08/15/2022 2:30 PM Medical  Record Number: [id number] Patient Account Number: [id number] Date of Birth/Sex: Treating RN: 23-Dec-1985 (36 y.o. [name]) Primary Care Provider: PCP, NO Other Clinician: Referring Provider: Treating Provider/Extender: Trixie Rude NHO PE, [place] THA RINE Weeks in Treatment: 0 Constitutional respirations regular, non-labored and within target range for patient.. Cardiovascular 2+ dorsalis pedis/posterior tibialis pulses. Psychiatric pleasant and cooperative. Notes Right foot: T the plantar aspect of the foot and in between the toes there  is maceration with skin breakdown, Nonviable tissue and moderate odor. No o increased warmth, erythema or purulent drainage. Electronic Signature(s) Signed: 08/15/2022 4:46:31 PM By: Geralyn Corwin DO Entered By: Geralyn Corwin on 08/15/2022 15:57:54 Greenleaf, Emilia Beck ([id number]) [id number] Page 3 of 8 -------------------------------------------------------------------------------- Physician Orders Details Patient Name: Date of Service: Mark [name], Mark [name] 08/15/2022 2:30 PM Medical Record Number: [id number] Patient Account Number: [id number] Date of Birth/Sex: Treating RN: 07/31/1986 (36 y.o. Lytle Michaels Primary Care Provider: PCP, NO Other Clinician: Referring Provider: Treating Provider/Extender: Trixie Rude NHO PE, [place] THA RINE Weeks in Treatment: 0 Verbal / Phone Orders: No Diagnosis Coding ICD-10 Coding Code Description S91.301A Unspecified open wound, right foot, initial encounter L08.9 Local infection of the skin and subcutaneous tissue, unspecified I10 Essential (primary) hypertension Follow-up Appointments ppointment in 1 week. - with Dr. [name] Return A Anesthetic (In clinic) Topical Lidocaine 5% applied to wound bed (In clinic) Topical Lidocaine 4% applied to wound bed Bathing/ Shower/ Hygiene May shower and wash wound with soap and water. Off-Loading Open toe surgical shoe  to: Wound Treatment Wound #1 - Foot Wound Laterality: Right Cleanser: Soap and Water 1 x Per Day/30 Days Discharge Instructions: May shower and wash wound with dial antibacterial soap and water prior to dressing change. Topical: Mupirocin Ointment 1 x Per Day/30 Days Discharge Instructions: Apply Mupirocin (Bactroban) as instructed Topical: Lamisil 1% Antifungal Spray 1 x Per Day/30 Days Secondary Dressing: Woven Gauze Sponge, Non-Sterile 4x4 in 1 x Per Day/30 Days Discharge Instructions: Apply over primary dressing as directed. Secured With: Insurance underwriter, Sterile 2x75 (in/in) 1 x Per Day/30 Days Discharge Instructions: Secure with stretch gauze as directed. Secured With: Paper Tape, 2x10 (in/yd) 1 x Per Day/30 Days Discharge Instructions: Secure dressing with tape as directed. Laboratory erobe culture (MICRO) - PCR Culture: Sagis - (ICD10 S91.301A - Unspecified open wound, right Bacteria identified in Unspecified specimen by A foot, initial encounter) LOINC Code: 634-6 Convenience Name: Aerobic culture-specimen not specified Electronic Signature(s) Signed: 08/15/2022 4:46:31 PM By: Geralyn Corwin DO Entered By: Geralyn Corwin on 08/15/2022 15:58:00 Mark Gordon ([id number]) [id number] Page 4 of 8 -------------------------------------------------------------------------------- Problem List Details Patient Name: Date of Service: Mark [name], Mark [name] 08/15/2022 2:30 PM Medical Record Number: [id number] Patient Account Number: [id number] Date of Birth/Sex: Treating RN: 02-11-1986 (36 y.o. [name]) Primary Care Provider: PCP, NO Other Clinician: Referring Provider: Treating Provider/Extender: Trixie Rude NHO PE, [place] THA RINE Weeks in Treatment: 0 Active Problems ICD-10 Encounter Code Description Active Date MDM Diagnosis S91.301A Unspecified open wound, right foot, initial encounter 08/15/2022 No Yes L08.9 Local infection of  the skin and subcutaneous tissue, unspecified 08/15/2022 No Yes I10 Essential (primary) hypertension 08/15/2022 No Yes Inactive Problems Resolved Problems Electronic Signature(s) Signed: 08/15/2022 4:46:31 PM By: Geralyn Corwin DO Entered By: Geralyn Corwin on 08/15/2022 15:39:00 -------------------------------------------------------------------------------- Progress Note Details Patient Name: Date of Service: Mark [name]. 08/15/2022 2:30 PM Medical Record Number: [id number] Patient Account Number: [id number] Date of Birth/Sex: Treating RN: May 10, 1986 (36 y.o. [name]) Primary Care Provider: PCP, NO Other Clinician: Referring Provider: Treating Provider/Extender: Trixie Rude NHO PE, [place] THA RINE Weeks in Treatment: 0 Subjective Chief Complaint Information obtained from Patient 08/15/2022; right foot wounds History of Present Illness (HPI) Admission 08/15/2022 Mark [name] is a 36 year old male with essential hypertension that presents to the clinic for a 1-1/2-week history of right foot wounds. He states that there was some breakdown to his foot after scratching it and then he  wore his steel toed shoes and started developing odor and breakdown of the skin. He went to the ED for this issue on 08/13/2022. He was given oral clindamycin and mupirocin ointment. He stated he started using this in the past 1 to 2 days. He currently denies systemic signs of infection. No history of diabetes. Patient History Information obtained from Patient, Chart. Mark [name], Mark [name] ([id number]) [id number] Page 5 of 8 Allergies No Known Allergies Family History Cancer - Maternal Grandparents, Hypertension - Father, No family history of Diabetes, Heart Disease, Hereditary Spherocytosis, Kidney Disease, Lung Disease, Seizures, Stroke, Thyroid Problems, Tuberculosis. Social History Former smoker - Field seismologistVapes, Marital Status - Single, Alcohol Use - Rarely, Drug Use -  Prior History, Caffeine Use - Daily. Medical History Cardiovascular Patient has history of Hypertension Review of Systems (ROS) Eyes Denies complaints or symptoms of Dry Eyes, Vision Changes, Glasses / Contacts. Ear/Nose/Mouth/Throat Denies complaints or symptoms of Chronic sinus problems or rhinitis. Respiratory Denies complaints or symptoms of Chronic or frequent coughs, Shortness of Breath. Gastrointestinal Denies complaints or symptoms of Frequent diarrhea, Nausea, Vomiting. Endocrine Denies complaints or symptoms of Heat/cold intolerance. Genitourinary Denies complaints or symptoms of Frequent urination. Integumentary (Skin) Complains or has symptoms of Wounds. Musculoskeletal Denies complaints or symptoms of Muscle Pain, Muscle Weakness. Neurologic Denies complaints or symptoms of Numbness/parasthesias. Psychiatric Denies complaints or symptoms of Claustrophobia. Objective Constitutional respirations regular, non-labored and within target range for patient.. Vitals Time Taken: 2:50 PM, Height: 71 in, Source: Stated, Weight: 195 lbs, Source: Stated, BMI: 27.2, Temperature: 98.5 F, Pulse: 97 bpm, Respiratory Rate: 18 breaths/min, Blood Pressure: 172/100 mmHg. Cardiovascular 2+ dorsalis pedis/posterior tibialis pulses. Psychiatric pleasant and cooperative. General Notes: Right foot: T the plantar aspect of the foot and in between the toes there is maceration with skin breakdown, Nonviable tissue and moderate o odor. No increased warmth, erythema or purulent drainage. Integumentary (Hair, Skin) Wound #1 status is Open. Original cause of wound was Trauma. The date acquired was: 07/31/2022. The wound is located on the Right Foot. The wound measures 6.7cm length x 7cm width x 0.1cm depth; 36.835cm^2 area and 3.684cm^3 volume. There is Fat Layer (Subcutaneous Tissue) exposed. There is no tunneling or undermining noted. There is a medium amount of purulent drainage noted. The  wound margin is distinct with the outline attached to the wound base. There is medium (34-66%) red granulation within the wound bed. There is a medium (34-66%) amount of necrotic tissue within the wound bed including Adherent Slough. The periwound skin appearance had no abnormalities noted for texture. The periwound skin appearance did not exhibit: Dry/Scaly, Maceration, Atrophie Blanche, Cyanosis, Ecchymosis, Hemosiderin Staining, Mottled, Pallor, Rubor, Erythema. Periwound temperature was noted as No Abnormality. Assessment Active Problems ICD-10 Unspecified open wound, right foot, initial encounter Local infection of the skin and subcutaneous tissue, unspecified Essential (primary) hypertension Patient presents with a 1 to 2-week history of wounds to his right foot in the setting of infection. He would benefit greatly from [facility]Keystone spray. A PCR Mark [name], Mark [name] ([id number]) [id number] Page 6 of 8 culture was obtained. I debrided nonviable tissue. He is currently on clindamycin and mupirocin ointment. I recommended continuing his oral antibiotics. He can use the mupirocin ointment along with Lamisil antifungal spray daily until his [place] [place] spray [place]. We gave him a surgical shoe. I recommended he keep the area covered and do daily dressing changes. Procedures Wound #1 Pre-procedure diagnosis of Wound #1 is an Infection - not elsewhere classified located on the Right Foot .  There was a Selective/Open Wound Skin/Dermis Debridement with a total area of 46.9 sq cm performed by Kalman Shan, DO. With the following instrument(s): Curette Material removed includes Slough and Skin: Dermis and after achieving pain control using Lidocaine 4% T opical Solution. 1 specimen was taken by a Swab and sent to the lab per facility protocol. A time out was conducted at 15:19, prior to the start of the procedure. A Minimum amount of bleeding was controlled with  Pressure. The procedure was tolerated well. Post Debridement Measurements: 6.7cm length x 7cm width x 0.1cm depth; 3.684cm^3 volume. Character of Wound/Ulcer Post Debridement is stable. Post procedure Diagnosis Wound #1: Same as Pre-Procedure General Notes: Procdedure scribed for Dr. Heber Lovejoy by Janan Halter, RN. Plan Follow-up Appointments: Return Appointment in 1 week. - with Dr. Heber [place] Anesthetic: (In clinic) Topical Lidocaine 5% applied to wound bed (In clinic) Topical Lidocaine 4% applied to wound bed Bathing/ Shower/ Hygiene: May shower and wash wound with soap and water. Off-Loading: Open toe surgical shoe to: Laboratory ordered were: Aerobic culture-specimen not specified - PCR Culture: Sagis WOUND #1: - Foot Wound Laterality: Right Cleanser: Soap and Water 1 x Per Day/30 Days Discharge Instructions: May shower and wash wound with dial antibacterial soap and water prior to dressing change. Topical: Mupirocin Ointment 1 x Per Day/30 Days Discharge Instructions: Apply Mupirocin (Bactroban) as instructed Topical: Lamisil 1% Antifungal Spray 1 x Per Day/30 Days Secondary Dressing: Woven Gauze Sponge, Non-Sterile 4x4 in 1 x Per Day/30 Days Discharge Instructions: Apply over primary dressing as directed. Secured With: Child psychotherapist, Sterile 2x75 (in/in) 1 x Per Day/30 Days Discharge Instructions: Secure with stretch gauze as directed. Secured With: Paper T ape, 2x10 (in/yd) 1 x Per Day/30 Days Discharge Instructions: Secure dressing with tape as directed. 1. In office sharp debridement 2. PCR culture[place] antibiotic spray 3. Mupirocin ointment and Lamisil antifungal spray 4. Surgical shoe 5. Follow-up in 1 week Electronic Signature(s) Signed: 08/15/2022 4:46:31 PM By: Kalman Shan DO Entered By: Kalman Shan on 08/15/2022 15:59:56 -------------------------------------------------------------------------------- HxROS Details Patient Name: Date  of Service: Mark Rooks. 08/15/2022 2:30 PM Medical Record Number: [id number] Patient Account Number: [id number] Date of Birth/Sex: Treating RN: Apr 25, 1986 (36 y.o. Marcheta Grammes Primary Care Provider: PCP, NO Other Clinician: Referring Provider: Treating Provider/Extender: Sindy Messing NHO PE, [place] THA RINE Weeks in Treatment: 0 Information Obtained From Patient Chart Eyes Mark [name], Mark [name] ([id number]) [id number] Page 7 of 8 Complaints and Symptoms: Negative for: Dry Eyes; Vision Changes; Glasses / Contacts Ear/Nose/Mouth/Throat Complaints and Symptoms: Negative for: Chronic sinus problems or rhinitis Respiratory Complaints and Symptoms: Negative for: Chronic or frequent coughs; Shortness of Breath Gastrointestinal Complaints and Symptoms: Negative for: Frequent diarrhea; Nausea; Vomiting Endocrine Complaints and Symptoms: Negative for: Heat/cold intolerance Genitourinary Complaints and Symptoms: Negative for: Frequent urination Integumentary (Skin) Complaints and Symptoms: Positive for: Wounds Musculoskeletal Complaints and Symptoms: Negative for: Muscle Pain; Muscle Weakness Neurologic Complaints and Symptoms: Negative for: Numbness/parasthesias Psychiatric Complaints and Symptoms: Negative for: Claustrophobia Hematologic/Lymphatic Cardiovascular Medical History: Positive for: Hypertension Immunological Oncologic Immunizations Pneumococcal Vaccine: Received Pneumococcal Vaccination: No Implantable Devices None Family and Social History Cancer: Yes - Maternal Grandparents; Diabetes: No; Heart Disease: No; Hereditary Spherocytosis: No; Hypertension: Yes - Father; Kidney Disease: No; Lung Disease: No; Seizures: No; Stroke: No; Thyroid Problems: No; Tuberculosis: No; Former smoker - Vapes; Marital Status - Single; Alcohol Use: Rarely; Drug Use: Prior History; Caffeine Use: Daily; Financial Concerns: No; Food, Clothing  or Shelter Needs: No; Support  System Lacking: No; Transportation Concerns: No Electronic Signature(s) Signed: 08/15/2022 4:24:49 PM By: Isabell Jarvis ([id number]) [id number] Page 8 of 8 Signed: 08/15/2022 4:46:31 PM By: Geralyn Corwin DO Entered By: Antonieta Iba on 08/15/2022 14:54:36 -------------------------------------------------------------------------------- SuperBill Details Patient Name: Date of Service: Mark [name] 08/15/2022 Medical Record Number: [id number] Patient Account Number: [id number] Date of Birth/Sex: Treating RN: 1986/09/27 (36 y.o. Lytle Michaels Primary Care Provider: PCP, NO Other Clinician: Referring Provider: Treating Provider/Extender: Trixie Rude NHO PE, [place] THA RINE Weeks in Treatment: 0 Diagnosis Coding ICD-10 Codes Code Description S91.301A Unspecified open wound, right foot, initial encounter L08.9 Local infection of the skin and subcutaneous tissue, unspecified I10 Essential (primary) hypertension Facility Procedures : CPT4 Code: [id number] Description: 99213 - WOUND CARE VISIT-LEV 3 EST PT Modifier: 25 Quantity: 1 : CPT4 Code: [id number] Description: 97597 - DEBRIDE WOUND 1ST 20 SQ CM OR < ICD-10 Diagnosis Description S91.301A Unspecified open wound, right foot, initial encounter Modifier: Quantity: 1 : CPT4 Code: [id number] Description: 97598 - DEBRIDE WOUND EA ADDL 20 SQ CM ICD-10 Diagnosis Description S91.301A Unspecified open wound, right foot, initial encounter Modifier: Quantity: 2 Physician Procedures : CPT4 Code Description Modifier [id number] 99204 - WC PHYS LEVEL 4 - NEW PT ICD-10 Diagnosis Description S91.301A Unspecified open wound, right foot, initial encounter L08.9 Local infection of the skin and subcutaneous tissue, unspecified I10 Essential  (primary) hypertension Quantity: 1 : [id number] 97597 - WC PHYS DEBR WO ANESTH 20 SQ CM ICD-10 Diagnosis Description S91.301A  Unspecified open wound, right foot, initial encounter Quantity: 1 : [id number] 97598 - WC PHYS DEBR WO ANESTH EA ADD 20 CM ICD-10 Diagnosis Description S91.301A Unspecified open wound, right foot, initial encounter Quantity: 2 Electronic Signature(s) Signed: 08/15/2022 4:46:31 PM By: Geralyn Corwin DO Entered By: Geralyn Corwin on 08/15/2022 16:00:13

## 2022-08-15 NOTE — Progress Notes (Signed)
TAKUYA, LARICCIA ([id number]) [id number] Page 1 of 4 Visit Report for 08/15/2022 Abuse Risk Screen Details Patient Name: Date of Service: RENAUD, CELLI. 08/15/2022 2:30 PM Medical Record Number: [id number] Patient Account Number: [id number] Date of Birth/Sex: Treating RN: [birth year] (36 y.o. Lytle Michaels Primary Care Amadeo Coke: PCP, NO Other Clinician: Referring Chau Sawin: Treating Arli Bree/Extender: Trixie Rude NHO PE, [place] THA RINE Weeks in Treatment: 0 Abuse Risk Screen Items Answer ABUSE RISK SCREEN: Has anyone close to you tried to hurt or harm you recentlyo No Do you feel uncomfortable with anyone in your familyo No Has anyone forced you do things that you didnt want to doo No Electronic Signature(s) Signed: 08/15/2022 4:24:49 PM By: Antonieta Iba Entered By: Antonieta Iba on 08/15/2022 14:54:56 -------------------------------------------------------------------------------- Activities of Daily Living Details Patient Name: Date of Service: DESHONE, LYSSY 08/15/2022 2:30 PM Medical Record Number: [id number] Patient Account Number: [id number] Date of Birth/Sex: Treating RN: 12/05/1985 (36 y.o. Lytle Michaels Primary Care Hanson Medeiros: PCP, NO Other Clinician: Referring Keona Bilyeu: Treating Windy Dudek/Extender: Trixie Rude NHO PE, [place] THA RINE Weeks in Treatment: 0 Activities of Daily Living Items Answer Activities of Daily Living (Please select one for each item) Drive Automobile Completely Able T Medications ake Completely Able Use T elephone Completely Able Care for Appearance Completely Able Use T oilet Completely Able Bath / Shower Completely Able Dress Self Completely Able Feed Self Completely Able Walk Completely Able Get In / Out Bed Completely Able Housework Completely Able Prepare Meals Completely Able Handle Money Completely Able Shop for Self Completely Able Electronic Signature(s) Signed:  08/15/2022 4:24:49 PM By: Antonieta Iba Entered By: Antonieta Iba on 08/15/2022 14:55:17 Farrel Gordon ([id number]) [id number] Page 2 of 4 -------------------------------------------------------------------------------- Education Screening Details Patient Name: Date of Service: ONOFRE, GAINS 08/15/2022 2:30 PM Medical Record Number: [id number] Patient Account Number: [id number] Date of Birth/Sex: Treating RN: 31-Jul-1986 (36 y.o. Lytle Michaels Primary Care Jenan Ellegood: PCP, NO Other Clinician: Referring Horacio Werth: Treating Renato Spellman/Extender: Trixie Rude NHO PE, [place] THA RINE Weeks in Treatment: 0 Primary Learner Assessed: Patient Learning Preferences/Education Level/Primary Language Learning Preference: Explanation, Demonstration, Printed Material Highest Education Level: High School Preferred Language: English Cognitive Barrier Language Barrier: No Translator Needed: No Memory Deficit: No Emotional Barrier: No Cultural/Religious Beliefs Affecting Medical Care: No Physical Barrier Impaired Vision: No Impaired Hearing: No Decreased Hand dexterity: No Knowledge/Comprehension Knowledge Level: High Comprehension Level: High Ability to understand written instructions: High Ability to understand verbal instructions: High Motivation Anxiety Level: Calm Cooperation: Cooperative Education Importance: Acknowledges Need Interest in Health Problems: Asks Questions Perception: Coherent Willingness to Engage in Self-Management High Activities: Readiness to Engage in Self-Management High Activities: Electronic Signature(s) Signed: 08/15/2022 4:24:49 PM By: Antonieta Iba Entered By: Antonieta Iba on 08/15/2022 14:55:43 -------------------------------------------------------------------------------- Fall Risk Assessment Details Patient Name: Date of Service: [name]. 08/15/2022 2:30 PM Medical Record Number:  [id number] Patient Account Number: [id number] Date of Birth/Sex: Treating RN: 1986/04/09 (36 y.o. Lytle Michaels Primary Care Anina Schnake: PCP, NO Other Clinician: Referring Sherelle Castelli: Treating Brielyn Bosak/Extender: Trixie Rude NHO PE, [place] THA RINE Weeks in Treatment: 0 Fall Risk Assessment Items Have you had 2 or more falls in the last 12 monthso 0 No [place], [place] M ([id number]) [phone] [id number] Page 3 of 4 Have you had any fall that resulted in injury in the last 12 monthso 0 No FALLS RISK SCREEN History of falling - immediate or within 3 months 0 No Secondary diagnosis (Do you have  2 or more medical diagnoseso) 0 No Ambulatory aid None/bed rest/wheelchair/nurse 0 Yes Crutches/cane/walker 0 No Furniture 0 No Intravenous therapy Access/Saline/Heparin Lock 0 No Gait/Transferring Normal/ bed rest/ wheelchair 0 Yes Weak (short steps with or without shuffle, stooped but able to lift head while walking, may seek 0 No support from furniture) Impaired (short steps with shuffle, may have difficulty arising from chair, head down, impaired 0 No balance) Mental Status Oriented to own ability 0 Yes Electronic Signature(s) Signed: 08/15/2022 4:24:49 PM By: Lorrin Jackson Entered By: Lorrin Jackson on 08/15/2022 14:55:59 -------------------------------------------------------------------------------- Foot Assessment Details Patient Name: Date of Service: Governor Rooks. 08/15/2022 2:30 PM Medical Record Number: [id number] Patient Account Number: [id number] Date of Birth/Sex: Treating RN: [birth year] (36 y.o. Marcheta Grammes Primary Care Reyce Lubeck: PCP, NO Other Clinician: Referring Damario Gillie: Treating Tylea Hise/Extender: Sindy Messing NHO PE, [place] THA RINE Weeks in Treatment: 0 Foot Assessment Items Site Locations + = Sensation present, - = Sensation absent, C = Callus, U = Ulcer R = Redness, W = Warmth, M = Maceration, PU = Pre-ulcerative  lesion F = Fissure, S = Swelling, D = Dryness Assessment Right: Left: Other Deformity: No No Prior Foot Ulcer: No No Prior Amputation: No No Charcot Joint: No No Ambulatory Status: Ambulatory Without Help GaitHERSEL, MCMEEN ([id number]) [id number] Page 4 of 4 Electronic Signature(s) Signed: 08/15/2022 4:24:49 PM By: Lorrin Jackson Entered By: Lorrin Jackson on 08/15/2022 15:00:45 -------------------------------------------------------------------------------- Nutrition Risk Screening Details Patient Name: Date of Service: MARTON, MALIZIA 08/15/2022 2:30 PM Medical Record Number: [id number] Patient Account Number: [id number] Date of Birth/Sex: Treating RN: [birth year] (36 y.o. Marcheta Grammes Primary Care Marsia Cino: PCP, NO Other Clinician: Referring Thos Matsumoto: Treating Janika Jedlicka/Extender: Sindy Messing NHO PE, [place] THA RINE Weeks in Treatment: 0 Height (in): 71 Weight (lbs): 195 Body Mass Index (BMI): 27.2 Nutrition Risk Screening Items Score Screening NUTRITION RISK SCREEN: I have an illness or condition that made me change the kind and/or amount of food I eat 0 No I eat fewer than two meals per day 0 No I eat few fruits and vegetables, or milk products 0 No I have three or more drinks of beer, liquor or wine almost every day 0 No I have tooth or mouth problems that make it hard for me to eat 0 No I don't always have enough money to buy the food I need 0 No I eat alone most of the time 0 No I take three or more different prescribed or over-the-counter drugs a day 0 No Without wanting to, I have lost or gained 10 pounds in the last six months 0 No I am not always physically able to shop, cook and/or feed myself 0 No Nutrition Protocols Good Risk Protocol 0 No interventions needed Moderate Risk Protocol High Risk Proctocol Risk Level: Good Risk Score: 0 Electronic Signature(s) Signed: 08/15/2022 4:24:49 PM By: Lorrin Jackson Entered By: Lorrin Jackson on 08/15/2022 14:56:06

## 2022-08-17 ENCOUNTER — Telehealth (HOSPITAL_COMMUNITY): Payer: Self-pay

## 2022-08-17 MED ORDER — AMLODIPINE BESYLATE 5 MG PO TABS: 5.0000 mg | ORAL_TABLET | Freq: Every day | ORAL | 1 refills | Status: AC

## 2022-08-22 ENCOUNTER — Encounter (HOSPITAL_BASED_OUTPATIENT_CLINIC_OR_DEPARTMENT_OTHER): Payer: Self-pay | Admitting: Internal Medicine

## 2022-08-22 DIAGNOSIS — S91301A Unspecified open wound, right foot, initial encounter: Secondary | ICD-10-CM

## 2022-08-22 DIAGNOSIS — L089 Local infection of the skin and subcutaneous tissue, unspecified: Secondary | ICD-10-CM

## 2022-08-22 DIAGNOSIS — I10 Essential (primary) hypertension: Secondary | ICD-10-CM

## 2022-08-23 NOTE — Progress Notes (Signed)
VANNAK, MONTENEGRO ([id number]) [id number] Page 1 of 8 Visit Report for 08/22/2022 Chief Complaint Document Details Patient Name: Date of Service: Mark [name], Mark [name]. 08/22/2022 2:15 PM Medical Record Number: [id number] Patient Account Number: [id number] Date of Birth/Sex: Treating RN: 22-Feb-1986 (36 y.o. M) Primary Care Provider: PCP, NO Other Clinician: Referring Provider: Treating Provider/Extender: Trixie Rude NHO PE, [place] THA RINE Weeks in Treatment: 1 Information Obtained from: Patient Chief Complaint 08/15/2022; right foot wounds Electronic Signature(s) Signed: 08/22/2022 4:16:28 PM By: Geralyn Corwin DO Entered By: Geralyn Corwin on 08/22/2022 15:08:11 -------------------------------------------------------------------------------- Debridement Details Patient Name: Date of Service: Mark [name]. 08/22/2022 2:15 PM Medical Record Number: [id number] Patient Account Number: [id number] Date of Birth/Sex: Treating RN: 1986/07/18 (36 y.o. Tammy Sours Primary Care Provider: PCP, NO Other Clinician: Referring Provider: Treating Provider/Extender: Trixie Rude NHO PE, [place] THA RINE Weeks in Treatment: 1 Debridement Performed for Assessment: Wound #1 Right Foot Performed By: Physician Geralyn Corwin, DO Debridement Type: Debridement Level of Consciousness (Pre-procedure): Awake and Alert Pre-procedure Verification/Time Out Yes - 14:50 Taken: Start Time: 14:51 Pain Control: Lidocaine 5% topical ointment T Area Debrided (L x W): otal 5 (cm) x 5 (cm) = 25 (cm) Tissue and other material debrided: Viable, Non-Viable, Slough, Subcutaneous, Skin: Dermis , Skin: Epidermis, Slough Level: Skin/Subcutaneous Tissue Debridement Description: Excisional Instrument: Curette Bleeding: None End Time: 15:03 Procedural Pain: 0 Post Procedural Pain: 0 Response to Treatment: Procedure was tolerated well Level of Consciousness (Post- Awake  and Alert procedure): Post Debridement Measurements of Total Wound Length: (cm) 10 Width: (cm) 10.5 Depth: (cm) 0.1 Volume: (cm) 8.247 Character of Wound/Ulcer Post Debridement: Requires Further Debridement Post Procedure Diagnosis Same as TITO, AUSMUS ([id number]) [id number] Page 2 of 8 Electronic Signature(s) Signed: 08/22/2022 4:16:28 PM By: Geralyn Corwin DO Signed: 08/22/2022 6:08:05 PM By: Shawn Stall RN, BSN Entered By: Shawn Stall on 08/22/2022 15:04:36 -------------------------------------------------------------------------------- HPI Details Patient Name: Date of Service: Mark [name]. 08/22/2022 2:15 PM Medical Record Number: [id number] Patient Account Number: [id number] Date of Birth/Sex: Treating RN: 11-20-1985 (36 y.o. M) Primary Care Provider: PCP, NO Other Clinician: Referring Provider: Treating Provider/Extender: Trixie Rude NHO PE, CA THA RINE Weeks in Treatment: 1 History of Present Illness HPI Description: Admission 08/15/2022 [name] is a 36 year old male with essential hypertension that presents to the clinic for a 1-1/2-week history of right foot wounds. He states that there was some breakdown to his foot after scratching it and then he wore his steel toed shoes and started developing odor and breakdown of the skin. He went to the ED for this issue on 08/13/2022. He was given oral clindamycin and mupirocin ointment. He stated he started using this in the past 1 to 2 days. He currently denies systemic signs of infection. No history of diabetes. 10/19; patient presents for follow-up. He has been using mupirocin ointment with antifungal spray to the right foot wound. He had a PCR culture done at last clinic visit that grew high levels of Pseudomonas aeruginosa and Bacteroides fragilis. He had medium levels of Enterococcus bacillus and actinotignum schaalii. He currently denies systemic  signs of infection. He has no issues or complaints today. Electronic Signature(s) Signed: 08/22/2022 4:16:28 PM By: Geralyn Corwin DO Entered By: Geralyn Corwin on 08/22/2022 15:11:23 -------------------------------------------------------------------------------- Physical Exam Details Patient Name: Date of Service: Mark [name] 08/22/2022 2:15 PM Medical Record Number: [id number] Patient Account Number: [id number] Date of Birth/Sex: Treating RN: [birth year] (36 y.o. M) Primary Care  Provider: PCP, NO Other Clinician: Referring Provider: Treating Provider/Extender: Trixie Rude NHO PE, CA THA RINE Weeks in Treatment: 1 Constitutional respirations regular, non-labored and within target range for patient.. Cardiovascular 2+ dorsalis pedis/posterior tibialis pulses. Psychiatric pleasant and cooperative. Notes Right foot: T the plantar aspect of the foot and in between the toes there is maceration with skin breakdown, Nonviable tissue and moderate odor. No o increased warmth, erythema or purulent drainage. Electronic Signature(s) Signed: 08/22/2022 4:16:28 PM By: Geralyn Corwin DO Entered By: Geralyn Corwin on 08/22/2022 15:11:45 Farrel Gordon ([id number]) [id number] Page 3 of 8 -------------------------------------------------------------------------------- Physician Orders Details Patient Name: Date of Service: Mark [name] 08/22/2022 2:15 PM Medical Record Number: [id number] Patient Account Number: [id number] Date of Birth/Sex: Treating RN: 01/16/1986 (36 y.o. Tammy Sours Primary Care Provider: PCP, NO Other Clinician: Referring Provider: Treating Provider/Extender: Trixie Rude NHO PE, CA THA RINE Weeks in Treatment: 1 Verbal / Phone Orders: No Diagnosis Coding ICD-10 Coding Code Description S91.301A Unspecified open wound, right foot, initial encounter L08.9 Local infection of the skin and subcutaneous  tissue, unspecified I10 Essential (primary) hypertension Follow-up Appointments ppointment in 1 week. - with Dr. [name] Return A Other: - [facility] will call you about purchasing the topical antibiotics. Pick up oral antibiotics and Dakin's solution at your pharmacy. If you pharmacy does not have the Dakin's then purchase off of [employer] or pick up at [facility]. Anesthetic (In clinic) Topical Lidocaine 5% applied to wound bed Bathing/ Shower/ Hygiene May shower and wash wound with soap and water. Off-Loading Open toe surgical shoe to: Wound Treatment Wound #1 - Foot Wound Laterality: Right Cleanser: Soap and Water 1 x Per Day/30 Days Discharge Instructions: May shower and wash wound with dial antibacterial soap and water prior to dressing change. Topical: Lamisil 1% Antifungal Spray 1 x Per Day/30 Days Discharge Instructions: apply first. Topical: topical antibiotics spray from [facility] 1 x Per Day/30 Days Discharge Instructions: apply once it arrives start using and stop the dakin's solution. Prim Dressing: Dakin's Solution 0.25%, 16 (oz) 1 x Per Day/30 Days ary Discharge Instructions: Moisten gauze with Dakin's solution Secondary Dressing: Woven Gauze Sponge, Non-Sterile 4x4 in 1 x Per Day/30 Days Discharge Instructions: Apply over primary dressing as directed. Secured With: Insurance underwriter, Sterile 2x75 (in/in) 1 x Per Day/30 Days Discharge Instructions: Secure with stretch gauze as directed. Secured With: Paper Tape, 2x10 (in/yd) 1 x Per Day/30 Days Discharge Instructions: Secure dressing with tape as directed. Patient Medications llergies: No Known Allergies A Notifications Medication Indication Start End 08/22/2022 levofloxacin DOSE 1 - oral 750 mg tablet - 1 tablet oral daily x 7 days 08/22/2022 amoxicillin-pot clavulanate DOSE 1 - oral 875 mg-125 mg tablet - 1 tablet oral BID x 7 days 08/22/2022 Dakin's Solution DOSE 1 -  miscellaneous 0.125 % solution - moisten gauze for wet to dry dressings AUBERY, DOUTHAT ([id number]) [id number] Page 4 of 8 Electronic Signature(s) Signed: 08/22/2022 4:16:28 PM By: Geralyn Corwin DO Previous Signature: 08/22/2022 3:17:43 PM Version By: Geralyn Corwin DO Entered By: Geralyn Corwin on 08/22/2022 15:21:03 -------------------------------------------------------------------------------- Problem List Details Patient Name: Date of Service: Mark [name]. 08/22/2022 2:15 PM Medical Record Number: [id number] Patient Account Number: [id number] Date of Birth/Sex: Treating RN: 09-15-1986 (36 y.o. M) Primary Care Provider: PCP, NO Other Clinician: Referring Provider: Treating Provider/Extender: Trixie Rude NHO PE, CA THA RINE Weeks in Treatment: 1 Active Problems ICD-10 Encounter Code Description Active Date MDM Diagnosis  S91.301A Unspecified open wound, right foot, initial encounter 08/15/2022 No Yes L08.9 Local infection of the skin and subcutaneous tissue, unspecified 08/15/2022 No Yes I10 Essential (primary) hypertension 08/15/2022 No Yes Inactive Problems Resolved Problems Electronic Signature(s) Signed: 08/22/2022 4:16:28 PM By: Geralyn CorwinHoffman, Jerrold Haskell DO Entered By: Geralyn CorwinHoffman, Haiden Rawlinson on 08/22/2022 15:07:56 -------------------------------------------------------------------------------- Progress Note Details Patient Name: Date of Service: Mark FrankelFO X, Takeo M. 08/22/2022 2:15 PM Medical Record Number: [id number] Patient Account Number: [id number] Date of Birth/Sex: Treating RN: August 19, 1986 (36 y.o. M) Primary Care Provider: PCP, NO Other Clinician: Referring Provider: Treating Provider/Extender: Trixie RudeHoffman, Kenshin Splawn STA NHO PE, CA THA RINE Weeks in Treatment: 1 Subjective Chief Complaint Information obtained from Patient 08/15/2022; right foot wounds Farrel GordonFOX, Johnnathan M ([id number]) [id number] Page 5 of  8 History of Present Illness (HPI) Admission 08/15/2022 Mr. Deitrick Caryn SectionFox is a 36 year old male with essential hypertension that presents to the clinic for a 1-1/2-week history of right foot wounds. He states that there was some breakdown to his foot after scratching it and then he wore his steel toed shoes and started developing odor and breakdown of the skin. He went to the ED for this issue on 08/13/2022. He was given oral clindamycin and mupirocin ointment. He stated he started using this in the past 1 to 2 days. He currently denies systemic signs of infection. No history of diabetes. 10/19; patient presents for follow-up. He has been using mupirocin ointment with antifungal spray to the right foot wound. He had a PCR culture done at last clinic visit that grew high levels of Pseudomonas aeruginosa and Bacteroides fragilis. He had medium levels of Enterococcus bacillus and actinotignum schaalii. He currently denies systemic signs of infection. He has no issues or complaints today. Patient History Information obtained from Patient, Chart. Family History Cancer - Maternal Grandparents, Hypertension - Father, No family history of Diabetes, Heart Disease, Hereditary Spherocytosis, Kidney Disease, Lung Disease, Seizures, Stroke, Thyroid Problems, Tuberculosis. Social History Former smoker - Field seismologistVapes, Marital Status - Single, Alcohol Use - Rarely, Drug Use - Prior History, Caffeine Use - Daily. Medical History Cardiovascular Patient has history of Hypertension Objective Constitutional respirations regular, non-labored and within target range for patient.. Vitals Time Taken: 2:30 PM, Height: 71 in, Weight: 195 lbs, BMI: 27.2, Temperature: 98.5 F, Pulse: 105 bpm, Respiratory Rate: 18 breaths/min, Blood Pressure: 162/118 mmHg. Cardiovascular 2+ dorsalis pedis/posterior tibialis pulses. Psychiatric pleasant and cooperative. General Notes: Right foot: T the plantar aspect of the foot and in  between the toes there is maceration with skin breakdown, Nonviable tissue and moderate o odor. No increased warmth, erythema or purulent drainage. Integumentary (Hair, Skin) Wound #1 status is Open. Original cause of wound was Trauma. The date acquired was: 07/31/2022. The wound has been in treatment 1 weeks. The wound is located on the Right Foot. The wound measures 10cm length x 10.5cm width x 0.1cm depth; 82.467cm^2 area and 8.247cm^3 volume. There is Fat Layer (Subcutaneous Tissue) exposed. There is no tunneling or undermining noted. There is a medium amount of purulent drainage noted. The wound margin is distinct with the outline attached to the wound base. There is medium (34-66%) red granulation within the wound bed. There is a medium (34-66%) amount of necrotic tissue within the wound bed including Adherent Slough. The periwound skin appearance had no abnormalities noted for texture. The periwound skin appearance did not exhibit: Dry/Scaly, Maceration, Atrophie Blanche, Cyanosis, Ecchymosis, Hemosiderin Staining, Mottled, Pallor, Rubor, Erythema. Periwound temperature was noted as No Abnormality. Assessment Active Problems ICD-10 Unspecified open wound, right  foot, initial encounter Local infection of the skin and subcutaneous tissue, unspecified Essential (primary) hypertension Patient's wound is stable. I debrided nonviable tissue. Due to his PCR culture I will prescribe levofloxacin and Augmentin. We will also send off the culture for [facility] antibiotic spray. For now I recommended continuing with the antifungal spray but using Dakin's wet-to-dry dressings. Continue with surgical shoe. Follow-up in 1 week. Procedures Wound #1 JARRIS, KORTZ ([id number]) [id number] Page 6 of 8 Pre-procedure diagnosis of Wound #1 is an Infection - not elsewhere classified located on the Right Foot . There was a Excisional Skin/Subcutaneous Tissue Debridement with a  total area of 25 sq cm performed by Kalman Shan, DO. With the following instrument(s): Curette to remove Viable and Non-Viable tissue/material. Material removed includes Subcutaneous Tissue, Slough, Skin: Dermis, and Skin: Epidermis after achieving pain control using Lidocaine 5% topical ointment. A time out was conducted at 14:50, prior to the start of the procedure. There was no bleeding. The procedure was tolerated well with a pain level of 0 throughout and a pain level of 0 following the procedure. Post Debridement Measurements: 10cm length x 10.5cm width x 0.1cm depth; 8.247cm^3 volume. Character of Wound/Ulcer Post Debridement requires further debridement. Post procedure Diagnosis Wound #1: Same as Pre-Procedure Plan Follow-up Appointments: Return Appointment in 1 week. - with Dr. He[place] Other: - [place] will call you about purchasing the topical antibiotics. Pick up oral antibiotics and Dakin's solution at your pharmacy. If you pharmacy does not have the Dakin's then purchase off of [place] or pick up at [facility]. Anesthetic: (In clinic) Topical Lidocaine 5% applied to wound bed Bathing/ Shower/ Hygiene: May shower and wash wound with soap and water. Off-Loading: Open toe surgical shoe to: The following medication(s) was prescribed: levofloxacin oral 750 mg tablet 1 1 tablet oral daily x 7 days starting 08/22/2022 amoxicillin-pot clavulanate oral 875 mg-125 mg tablet 1 1 tablet oral BID x 7 days starting 08/22/2022 Dakin's Solution miscellaneous 0.125 % solution 1 moisten gauze for wet to dry dressings starting 08/22/2022 WOUND #1: - Foot Wound Laterality: Right Cleanser: Soap and Water 1 x Per Day/30 Days Discharge Instructions: May shower and wash wound with dial antibacterial soap and water prior to dressing change. Topical: Lamisil 1% Antifungal Spray 1 x Per Day/30 Days Discharge Instructions: apply first. Topical: topical antibiotics spray from [place] 1 x Per Day/30 Days Discharge Instructions: apply once it arrives start using and stop the dakin's solution. Prim Dressing: Dakin's Solution 0.25%, 16 (oz) 1 x Per Day/30 Days ary Discharge Instructions: Moisten gauze with Dakin's solution Secondary Dressing: Woven Gauze Sponge, Non-Sterile 4x4 in 1 x Per Day/30 Days Discharge Instructions: Apply over primary dressing as directed. Secured With: Child psychotherapist, Sterile 2x75 (in/in) 1 x Per Day/30 Days Discharge Instructions: Secure with stretch gauze as directed. Secured With: Paper T ape, 2x10 (in/yd) 1 x Per Day/30 Days Discharge Instructions: Secure dressing with tape as directed. 1. In office sharp debridement 2. Dakin's wet-to-dry dressings 3. Augmentin and levofloxacin 4. Surgical shoe 5. Order [facility] antibiotic spray 6. Follow-up in 1 week Electronic Signature(s) Signed: 08/22/2022 4:16:28 PM By: Kalman Shan DO Entered By: Kalman Shan on 08/22/2022 15:21:11 -------------------------------------------------------------------------------- HxROS Details Patient Name: Date of Service: Governor Rooks. 08/22/2022 2:15 PM Medical Record Number: [id number] Patient Account Number: [id number] Date of Birth/Sex: Treating RN: 01-Jun-1986 (36 y.o. M) Primary Care Provider: PCP, NO Other Clinician: Referring Provider: Treating Provider/Extender: Sindy Messing NHO PE, [place]  THA RINE Weeks in Treatment: 1 Information Obtained From Patient Chart Cardiovascular Medical HistoryKIYOSHI, SCHAAB ([id number]) [id number] Page 7 of 8 Positive for: Hypertension Immunizations Pneumococcal Vaccine: Received Pneumococcal Vaccination: No Implantable Devices None Family and Social History Cancer: Yes - Maternal Grandparents; Diabetes: No; Heart Disease: No; Hereditary Spherocytosis: No; Hypertension: Yes - Father; Kidney Disease: No; Lung Disease: No; Seizures: No; Stroke:  No; Thyroid Problems: No; Tuberculosis: No; Former smoker - Vapes; Marital Status - Single; Alcohol Use: Rarely; Drug Use: Prior History; Caffeine Use: Daily; Financial Concerns: No; Food, Clothing or Shelter Needs: No; Support System Lacking: No; Transportation Concerns: No Electronic Signature(s) Signed: 08/22/2022 4:16:28 PM By: Geralyn Corwin DO Entered By: Geralyn Corwin on 08/22/2022 15:11:27 -------------------------------------------------------------------------------- SuperBill Details Patient Name: Date of Service: Mark [name] 08/22/2022 Medical Record Number: [id number] Patient Account Number: [id number] Date of Birth/Sex: Treating RN: 1986/10/29 (36 y.o. Tammy Sours Primary Care Provider: PCP, NO Other Clinician: Referring Provider: Treating Provider/Extender: Trixie Rude NHO PE, [place] THA RINE Weeks in Treatment: 1 Diagnosis Coding ICD-10 Codes Code Description S91.301A Unspecified open wound, right foot, initial encounter L08.9 Local infection of the skin and subcutaneous tissue, unspecified I10 Essential (primary) hypertension Facility Procedures : CPT4 Code: 35573220 Description: 11042 - DEB SUBQ TISSUE 20 SQ CM/< ICD-10 Diagnosis Description S91.301A Unspecified open wound, right foot, initial encounter L08.9 Local infection of the skin and subcutaneous tissue, unspecified Modifier: Quantity: 1 : CPT4 Code: [id number] Description: 11045 - DEB SUBQ TISS EA ADDL 20CM ICD-10 Diagnosis Description S91.301A Unspecified open wound, right foot, initial encounter L08.9 Local infection of the skin and subcutaneous tissue, unspecified Modifier: Quantity: 1 Physician Procedures : CPT4 Code Description Modifier [id number] 99214 - WC PHYS LEVEL 4 - EST PT ICD-10 Diagnosis Description S91.301A Unspecified open wound, right foot, initial encounter L08.9 Local infection of the skin and subcutaneous tissue, unspecified I10 Essential  (primary)  hypertension Quantity: 1 : [id number] 11042 - WC PHYS SUBQ TISS 20 SQ CM ICD-10 Diagnosis Description S91.301A Unspecified open wound, right foot, initial encounter L08.9 Local infection of the skin and subcutaneous tissue, unspecified Soderberg, Whalen M ([id number])  [id number] Quantity: 1 df Page 8 of 8 : [id number] 11045 - WC PHYS SUBQ TISS EA ADDL 20 CM 1 ICD-10 Diagnosis Description S91.301A Unspecified open wound, right foot, initial encounter L08.9 Local infection of the skin and subcutaneous tissue, unspecified Quantity: Electronic Signature(s) Signed: 08/22/2022 4:16:28 PM By: Geralyn Corwin DO Entered By: Geralyn Corwin on 08/22/2022 15:22:25

## 2022-08-23 NOTE — Progress Notes (Signed)
Mark [name] ([id number]) [id number] Page 1 of 8 Visit Report for 08/22/2022 Arrival Information Details Patient Name: Date of Service: Mark [name], Mark [name]. 08/22/2022 2:15 PM Medical Record Number: [id number] Patient Account Number: [id number] Date of Birth/Sex: Treating RN: 07-13-1986 (36 y.o. M) Primary Care Cambry Spampinato: PCP, NO Other Clinician: Referring Chere Babson: Treating Jadier Rockers/Extender: Trixie Rude NHO PE, [place] THA RINE Weeks in Treatment: 1 Visit Information History Since Last Visit Added or deleted any medications: No Patient Arrived: Ambulatory Any new allergies or adverse reactions: No Arrival Time: 14:26 Had a fall or experienced change in No Accompanied By: self activities of daily living that may affect Transfer Assistance: None risk of falls: Patient Identification Verified: Yes Signs or symptoms of abuse/neglect since last visito No Secondary Verification Process Completed: Yes Hospitalized since last visit: No Patient Requires Transmission-Based Precautions: No Implantable device outside of the clinic excluding No Patient Has Alerts: No cellular tissue based products placed in the center since last visit: Has Dressing in Place as Prescribed: Yes Pain Present Now: No Electronic Signature(s) Signed: 08/22/2022 4:30:06 PM By: Thayer Dallas Entered By: Thayer Dallas on 08/22/2022 14:27:03 -------------------------------------------------------------------------------- Encounter Discharge Information Details Patient Name: Date of Service: Mark [name]. 08/22/2022 2:15 PM Medical Record Number: [id number] Patient Account Number: [id number] Date of Birth/Sex: Treating RN: Jan 31, 1986 (36 y.o. Tammy Sours Primary Care Pearla Mckinny: PCP, NO Other Clinician: Referring Antoinne Spadaccini: Treating Raynell Upton/Extender: Trixie Rude NHO PE, [place] THA RINE Weeks in Treatment: 1 Encounter Discharge Information Items Post Procedure  Vitals Discharge Condition: Stable Temperature (F): 98.5 Ambulatory Status: Ambulatory Pulse (bpm): 105 Discharge Destination: Home Respiratory Rate (breaths/min): 20 Transportation: Private Auto Blood Pressure (mmHg): 162/118 Accompanied By: self Schedule Follow-up Appointment: Yes Clinical Summary of Care: Electronic Signature(s) Signed: 08/22/2022 6:08:05 PM By: Shawn Stall RN, BSN Entered By: Shawn Stall on 08/22/2022 15:05:26 Farrel Gordon ([id number]) [id number] Page 2 of 8 -------------------------------------------------------------------------------- Lower Extremity Assessment Details Patient Name: Date of Service: Mark [name] 08/22/2022 2:15 PM Medical Record Number: [id number] Patient Account Number: [id number] Date of Birth/Sex: Treating RN: 1985-12-02 (36 y.o. M) Primary Care Ganon Demasi: PCP, NO Other Clinician: Referring Dierdra Salameh: Treating Tory Septer/Extender: Trixie Rude NHO PE, CA THA RINE Weeks in Treatment: 1 Edema Assessment Assessed: [Left: No] [Right: No] Edema: [Left: N] [Right: o] Calf Left: Right: Point of Measurement: 36 cm From Medial Instep 38.5 cm Ankle Left: Right: Point of Measurement: 8 cm From Medial Instep 24.5 cm Vascular Assessment Pulses: Dorsalis Pedis Palpable: [Left:Yes] Electronic Signature(s) Signed: 08/22/2022 4:30:06 PM By: Thayer Dallas Entered By: Thayer Dallas on 08/22/2022 14:39:37 -------------------------------------------------------------------------------- Multi Wound Chart Details Patient Name: Date of Service: Mark [name]. 08/22/2022 2:15 PM Medical Record Number: [id number] Patient Account Number: [id number] Date of Birth/Sex: Treating RN: 01-30-1986 (36 y.o. M) Primary Care Einar Nolasco: PCP, NO Other Clinician: Referring Chaeli Judy: Treating Jedd Schulenburg/Extender: Trixie Rude NHO PE, [place] THA RINE Weeks in Treatment: 1 Vital Signs Height(in): 71 Pulse(bpm):  105 Weight(lbs): 195 Blood Pressure(mmHg): 162/118 Body Mass Index(BMI): 27.2 Temperature(F): 98.5 Respiratory Rate(breaths/min): 18 [1:Photos:] [N/A:N/A] Right Foot N/A N/A Wound Location: Trauma N/A N/A Wounding Event: Infection - not elsewhere classified N/A N/A Primary Etiology: Hypertension N/A N/A Comorbid History: 07/31/2022 N/A N/A Date Acquired: 1 N/A N/A Weeks of Treatment: Open N/A N/A Wound Status: No N/A N/A Wound Recurrence: 10x10.5x0.1 N/A N/A Measurements L x W x D (cm) 82.467 N/A N/A A (cm) : rea 8.247 N/A N/A Volume (cm) : -123.90% N/A N/A % Reduction in A  rea: -123.90% N/A N/A % Reduction in Volume: Full Thickness Without Exposed N/A N/A Classification: Support Structures Medium N/A N/A Exudate A mount: Purulent N/A N/A Exudate Type: yellow, brown, green N/A N/A Exudate Color: Distinct, outline attached N/A N/A Wound Margin: Medium (34-66%) N/A N/A Granulation A mount: Red N/A N/A Granulation Quality: Medium (34-66%) N/A N/A Necrotic A mount: Fat Layer (Subcutaneous Tissue): Yes N/A N/A Exposed Structures: Fascia: No Tendon: No Muscle: No Joint: No Bone: No None N/A N/A Epithelialization: Debridement - Excisional N/A N/A Debridement: Pre-procedure Verification/Time Out 14:50 N/A N/A Taken: Lidocaine 5% topical ointment N/A N/A Pain Control: Subcutaneous, Slough N/A N/A Tissue Debrided: Skin/Subcutaneous Tissue N/A N/A Level: 25 N/A N/A Debridement A (sq cm): rea Curette N/A N/A Instrument: None N/A N/A Bleeding: 0 N/A N/A Procedural Pain: 0 N/A N/A Post Procedural Pain: Procedure was tolerated well N/A N/A Debridement Treatment Response: 10x10.5x0.1 N/A N/A Post Debridement Measurements L x W x D (cm) 8.247 N/A N/A Post Debridement Volume: (cm) Excoriation: No N/A N/A Periwound Skin Texture: Induration: No Callus: No Crepitus: No Rash: No Scarring: No Maceration: No N/A N/A Periwound Skin  Moisture: Dry/Scaly: No Atrophie Blanche: No N/A N/A Periwound Skin Color: Cyanosis: No Ecchymosis: No Erythema: No Hemosiderin Staining: No Mottled: No Pallor: No Rubor: No No Abnormality N/A N/A Temperature: Debridement N/A N/A Procedures Performed: Treatment Notes Wound #1 (Foot) Wound Laterality: Right Cleanser Soap and Water Discharge Instruction: May shower and wash wound with dial antibacterial soap and water prior to dressing change. Peri-Wound Care Topical NADAV, SWINDELL ([id number]) [id number] Page 4 of 8 Lamisil 1% Antifungal Spray Discharge Instruction: apply first. topical antibiotics spray from [facility] Discharge Instruction: apply once it arrives start using and stop the dakin's solution. Primary Dressing Dakin's Solution 0.25%, 16 (oz) Discharge Instruction: Moisten gauze with Dakin's solution Secondary Dressing Woven Gauze Sponge, Non-Sterile 4x4 in Discharge Instruction: Apply over primary dressing as directed. Secured With Conforming Stretch Gauze Bandage, Sterile 2x75 (in/in) Discharge Instruction: Secure with stretch gauze as directed. Paper Tape, 2x10 (in/yd) Discharge Instruction: Secure dressing with tape as directed. Compression Wrap Compression Stockings Add-Ons Electronic Signature(s) Signed: 08/22/2022 4:16:28 PM By: Geralyn Corwin DO Entered By: Geralyn Corwin on 08/22/2022 15:08:01 -------------------------------------------------------------------------------- Multi-Disciplinary Care Plan Details Patient Name: Date of Service: Mark [name]. 08/22/2022 2:15 PM Medical Record Number: [id number] Patient Account Number: [id number] Date of Birth/Sex: Treating RN: Jul 14, 1986 (36 y.o. Tammy Sours Primary Care Tania Steinhauser: PCP, NO Other Clinician: Referring Hughie Melroy: Treating Emberleigh Reily/Extender: Trixie Rude NHO PE, CA THA RINE Weeks in Treatment: 1 Active Inactive Wound/Skin  Impairment Nursing Diagnoses: Impaired tissue integrity Goals: Patient/caregiver will verbalize understanding of skin care regimen Date Initiated: 08/15/2022 Target Resolution Date: 10/04/2022 Goal Status: Active Ulcer/skin breakdown will have a volume reduction of 30% by week 4 Date Initiated: 08/15/2022 Target Resolution Date: 10/04/2022 Goal Status: Active Interventions: Assess patient/caregiver ability to obtain necessary supplies Assess patient/caregiver ability to perform ulcer/skin care regimen upon admission and as needed Assess ulceration(s) every visit Provide education on ulcer and skin care Treatment Activities: Topical wound management initiated : 08/15/2022 Notes: DESI, ROWE ([id number]) [id number] Page 5 of 8 Electronic Signature(s) Signed: 08/22/2022 6:08:05 PM By: Shawn Stall RN, BSN Entered By: Shawn Stall on 08/22/2022 14:52:48 -------------------------------------------------------------------------------- Pain Assessment Details Patient Name: Date of Service: Mark [name]. 08/22/2022 2:15 PM Medical Record Number: [id number] Patient Account Number: [id number] Date of Birth/Sex: Treating RN: 1986/06/09 (36 y.o. M) Primary Care Teniya Filter: PCP, NO Other Clinician: Referring Kurt Hoffmeier:  Treating Bomani Oommen/Extender: Trixie Rude NHO PE, CA THA RINE Weeks in Treatment: 1 Active Problems Location of Pain Severity and Description of Pain Patient Has Paino No Site Locations Pain Management and Medication Current Pain Management: Electronic Signature(s) Signed: 08/22/2022 4:30:06 PM By: Thayer Dallas Entered By: Thayer Dallas on 08/22/2022 14:30:41 -------------------------------------------------------------------------------- Patient/Caregiver Education Details Patient Name: Date of Service: Mark [name] 10/19/2023andnbsp2:15 PM Medical Record Number: [id number] Patient Account Number: [id number] Date of  Birth/Gender: Treating RN: Jun 11, 1986 (36 y.o. Tammy Sours Primary Care Physician: PCP, NO Other Clinician: Referring Physician: Treating Physician/Extender: Trixie Rude NHO PE, CA THA RINE Weeks in Treatment: 1 Education Assessment Education Provided To: Patient AARAN, ENBERG ([id number]) [id number] Page 6 of 8 Education Topics Provided Wound/Skin Impairment: Handouts: Skin Care Do's and Dont's Methods: Explain/Verbal Responses: Reinforcements needed Electronic Signature(s) Signed: 08/22/2022 6:08:05 PM By: Shawn Stall RN, BSN Entered By: Shawn Stall on 08/22/2022 14:52:58 -------------------------------------------------------------------------------- Wound Assessment Details Patient Name: Date of Service: Mark [name]. 08/22/2022 2:15 PM Medical Record Number: [id number] Patient Account Number: [id number] Date of Birth/Sex: Treating RN: 25-Feb-1986 (36 y.o. M) Primary Care Brion Hedges: PCP, NO Other Clinician: Referring Tida Saner: Treating Delecia Vastine/Extender: Trixie Rude NHO PE, CA THA RINE Weeks in Treatment: 1 Wound Status Wound Number: 1 Primary Etiology: Infection - not elsewhere classified Wound Location: Right Foot Wound Status: Open Wounding Event: Trauma Comorbid History: Hypertension Date Acquired: 07/31/2022 Weeks Of Treatment: 1 Clustered Wound: No Photos Wound Measurements Length: (cm) 10 Width: (cm) 10.5 Depth: (cm) 0.1 Area: (cm) 82.467 Volume: (cm) 8.247 % Reduction in Area: -123.9% % Reduction in Volume: -123.9% Epithelialization: None Tunneling: No Undermining: No Wound Description Classification: Full Thickness Without Exposed Support Structures Wound Margin: Distinct, outline attached Exudate Amount: Medium Exudate Type: Purulent Exudate Color: yellow, brown, green Foul Odor After Cleansing: No Slough/Fibrino Yes Wound Bed Granulation Amount: Medium (34-66%) Exposed  Structure Granulation Quality: Red Fascia Exposed: No Necrotic Amount: Medium (34-66%) Fat Layer (Subcutaneous Tissue) Exposed: Yes Necrotic Quality: Adherent Slough Tendon Exposed: No Muscle Exposed: No Joint Exposed: No Bone Exposed: No Chirino, Emilia Beck ([id number]) [id number] Page 7 of 8 Periwound Skin Texture Texture Color No Abnormalities Noted: Yes No Abnormalities Noted: No Atrophie Blanche: No Moisture Cyanosis: No No Abnormalities Noted: No Ecchymosis: No Dry / Scaly: No Erythema: No Maceration: No Hemosiderin Staining: No Mottled: No Pallor: No Rubor: No Temperature / Pain Temperature: No Abnormality Treatment Notes Wound #1 (Foot) Wound Laterality: Right Cleanser Soap and Water Discharge Instruction: May shower and wash wound with dial antibacterial soap and water prior to dressing change. Peri-Wound Care Topical Lamisil 1% Antifungal Spray Discharge Instruction: apply first. topical antibiotics spray from [facility] Discharge Instruction: apply once it arrives start using and stop the dakin's solution. Primary Dressing Dakin's Solution 0.25%, 16 (oz) Discharge Instruction: Moisten gauze with Dakin's solution Secondary Dressing Woven Gauze Sponge, Non-Sterile 4x4 in Discharge Instruction: Apply over primary dressing as directed. Secured With Conforming Stretch Gauze Bandage, Sterile 2x75 (in/in) Discharge Instruction: Secure with stretch gauze as directed. Paper Tape, 2x10 (in/yd) Discharge Instruction: Secure dressing with tape as directed. Compression Wrap Compression Stockings Add-Ons Electronic Signature(s) Signed: 08/22/2022 4:30:06 PM By: Thayer Dallas Entered By: Thayer Dallas on 08/22/2022 14:37:29 -------------------------------------------------------------------------------- Vitals Details Patient Name: Date of Service: Mark [name]. 08/22/2022 2:15 PM Medical Record Number: [id number] Patient  Account Number: [id number] Date of Birth/Sex: Treating RN: 03/03/1986 (36 y.o. M) Primary Care Neal Trulson: PCP, NO Other Clinician: Referring Semaj Kham: Treating Neva Ramaswamy/Extender: Geralyn Corwin  STA NHO PE, CA THA RINE Weeks in Treatment: 1 Vital Signs Time Taken: 14:30 Temperature (F): 98.5 [place], Dhyan M ([id number]) [id number] Page 8 of 8 Height (in): 71 Pulse (bpm): 105 Weight (lbs): 195 Respiratory Rate (breaths/min): 18 Body Mass Index (BMI): 27.2 Blood Pressure (mmHg): 162/118 Reference Range: 80 - 120 mg / dl Electronic Signature(s) Signed: 08/22/2022 4:30:06 PM By: Erenest Blank Entered By: Erenest Blank on 08/22/2022 14:30:34

## 2022-08-29 ENCOUNTER — Encounter (HOSPITAL_BASED_OUTPATIENT_CLINIC_OR_DEPARTMENT_OTHER): Payer: Self-pay | Admitting: Internal Medicine

## 2022-08-29 DIAGNOSIS — S91301A Unspecified open wound, right foot, initial encounter: Secondary | ICD-10-CM

## 2022-08-29 NOTE — Progress Notes (Signed)
DEONTE, OTTING ([id number]) [id number] Page 1 of 8 Visit Report for 08/29/2022 Arrival Information Details Patient Name: Date of Service: BLAYTON, HUTTNER 08/29/2022 2:15 PM Medical Record Number: [id number] Patient Account Number: [id number] Date of Birth/Sex: Treating RN: [birth year] (36 y.o. [name] Primary Care Yoana Staib: PCP, NO Other Clinician: Referring Emmelina Mcloughlin: Treating Terressa Evola/Extender: Trixie Rude NHO PE, CA THA RINE Weeks in Treatment: 2 Visit Information History Since Last Visit Added or deleted any medications: No Patient Arrived: Ambulatory Any new allergies or adverse reactions: No Arrival Time: 14:25 Had a fall or experienced change in No Accompanied By: self activities of daily living that may affect Transfer Assistance: None risk of falls: Patient Identification Verified: Yes Signs or symptoms of abuse/neglect since last visito No Secondary Verification Process Completed: Yes Hospitalized since last visit: No Patient Requires Transmission-Based Precautions: No Implantable device outside of the clinic excluding No Patient Has Alerts: No cellular tissue based products placed in the center since last visit: Has Dressing in Place as Prescribed: Yes Pain Present Now: No Electronic Signature(s) Signed: 08/29/2022 4:24:03 PM By: Samuella Bruin Entered By: Samuella Bruin on 08/29/2022 14:25:18 -------------------------------------------------------------------------------- Encounter Discharge Information Details Patient Name: Date of Service: [name]. 08/29/2022 2:15 PM Medical Record Number: [id number] Patient Account Number: [id number] Date of Birth/Sex: Treating RN: 01/29/1986 (36 y.o. Lucious Groves Primary Care Aleksander Edmiston: PCP, NO Other Clinician: Referring Rhonna Holster: Treating Kenza Munar/Extender: Trixie Rude NHO PE, CA THA RINE Weeks in Treatment: 2 Encounter Discharge  Information Items Post Procedure Vitals Discharge Condition: Stable Temperature (F): 98.7 Ambulatory Status: Ambulatory Pulse (bpm): 74 Discharge Destination: Home Respiratory Rate (breaths/min): 17 Transportation: Private Auto Blood Pressure (mmHg): 161/111 Accompanied By: self Schedule Follow-up Appointment: Yes Clinical Summary of Care: Patient Declined Electronic Signature(s) Signed: 08/29/2022 4:44:37 PM By: Fonnie Mu RN Entered By: Fonnie Mu on 08/29/2022 14:45:06 Belloso, Ganon M ([id number]) [id number] Page 2 of 8 -------------------------------------------------------------------------------- Lower Extremity Assessment Details Patient Name: Date of Service: GUISEPPE, FLANAGAN 08/29/2022 2:15 PM Medical Record Number: [id number] Patient Account Number: [id number] Date of Birth/Sex: Treating RN: [birth year] (36 y.o. [name] Primary Care Marieke Lubke: PCP, NO Other Clinician: Referring Letty Salvi: Treating Lulie Hurd/Extender: Trixie Rude NHO PE, CA THA RINE Weeks in Treatment: 2 Edema Assessment Assessed: [Left: No] [Right: No] Edema: [Left: N] [Right: o] Calf Left: Right: Point of Measurement: 36 cm From Medial Instep 38 cm Ankle Left: Right: Point of Measurement: 8 cm From Medial Instep 24.2 cm Vascular Assessment Pulses: Dorsalis Pedis Palpable: [Left:Yes] Electronic Signature(s) Signed: 08/29/2022 4:24:03 PM By: Samuella Bruin Entered By: Samuella Bruin on 08/29/2022 14:29:43 -------------------------------------------------------------------------------- Multi Wound Chart Details Patient Name: Date of Service: [name]. 08/29/2022 2:15 PM Medical Record Number: [id number] Patient Account Number: [id number] Date of Birth/Sex: Treating RN: December 21, 1985 (36 y.o. Tammy Sours Primary Care Blaze Nylund: PCP, NO Other Clinician: Referring Rajanae Mantia: Treating Thelonious Kauffmann/Extender: Trixie Rude NHO PE, CA THA RINE Weeks in Treatment: 2 Vital Signs Height(in): 71 Pulse(bpm): 113 Weight(lbs): 195 Blood Pressure(mmHg): 167/110 Body Mass Index(BMI): 27.2 Temperature(F): 98.2 Respiratory Rate(breaths/min): 18 [1:Photos:] [N/A:N/A] Right Foot N/A N/A Wound Location: Trauma N/A N/A Wounding Event: Infection - not elsewhere classified N/A N/A Primary Etiology: Hypertension N/A N/A Comorbid History: 07/31/2022 N/A N/A Date Acquired: 2 N/A N/A Weeks of Treatment: Open N/A N/A Wound Status: No N/A N/A Wound Recurrence: 8.4x8.5x0.1 N/A N/A Measurements L x W x D (cm) 56.077 N/A N/A A (cm) : rea 5.608 N/A N/A Volume (cm) : -  52.20% N/A N/A % Reduction in A rea: -52.20% N/A N/A % Reduction in Volume: Full Thickness Without Exposed N/A N/A Classification: Support Structures Medium N/A N/A Exudate A mount: Serosanguineous N/A N/A Exudate Type: red, brown N/A N/A Exudate Color: Distinct, outline attached N/A N/A Wound Margin: Medium (34-66%) N/A N/A Granulation A mount: Red N/A N/A Granulation Quality: Medium (34-66%) N/A N/A Necrotic A mount: Eschar, Adherent Slough N/A N/A Necrotic Tissue: Fat Layer (Subcutaneous Tissue): Yes N/A N/A Exposed Structures: Fascia: No Tendon: No Muscle: No Joint: No Bone: No Small (1-33%) N/A N/A Epithelialization: Debridement - Selective/Open Wound N/A N/A Debridement: Pre-procedure Verification/Time Out 14:40 N/A N/A Taken: Lidocaine N/A N/A Pain Control: Slough N/A N/A Tissue Debrided: Non-Viable Tissue N/A N/A Level: 71.4 N/A N/A Debridement A (sq cm): rea Curette N/A N/A Instrument: Minimum N/A N/A Bleeding: Pressure N/A N/A Hemostasis A chieved: 0 N/A N/A Procedural Pain: 0 N/A N/A Post Procedural Pain: Procedure was tolerated well N/A N/A Debridement Treatment Response: 8.4x8.5x0.1 N/A N/A Post Debridement Measurements L x W x D (cm) 5.608 N/A N/A Post Debridement Volume:  (cm) Excoriation: No N/A N/A Periwound Skin Texture: Induration: No Callus: No Crepitus: No Rash: No Scarring: No Dry/Scaly: Yes N/A N/A Periwound Skin Moisture: Maceration: No Atrophie Blanche: No N/A N/A Periwound Skin Color: Cyanosis: No Ecchymosis: No Erythema: No Hemosiderin Staining: No Mottled: No Pallor: No Rubor: No No Abnormality N/A N/A Temperature: Debridement N/A N/A Procedures Performed: Treatment Notes Wound #1 (Foot) Wound Laterality: Right Cleanser Soap and Water Discharge Instruction: May shower and wash wound with dial antibacterial soap and water prior to dressing change. Peri-Wound Care Topical Lamisil 1% Antifungal Spray Discharge Instruction: apply first. topical antibiotics spray from [facility] Discharge Instruction: apply once it arrives start using and stop the dakin's solution. Primary Dressing Dakin's Solution 0.25%, 16 (oz) MERIT, MAYBEE ([id number]) [id number] Page 4 of 8 Discharge Instruction: Moisten gauze with Dakin's solution Secondary Dressing Woven Gauze Sponge, Non-Sterile 4x4 in Discharge Instruction: Apply over primary dressing as directed. Secured With Conforming Stretch Gauze Bandage, Sterile 2x75 (in/in) Discharge Instruction: Secure with stretch gauze as directed. Paper Tape, 2x10 (in/yd) Discharge Instruction: Secure dressing with tape as directed. Compression Wrap Compression Stockings Add-Ons Electronic Signature(s) Signed: 08/29/2022 4:37:04 PM By: Geralyn Corwin DO Signed: 08/29/2022 5:00:15 PM By: Shawn Stall RN, BSN Entered By: Geralyn Corwin on 08/29/2022 15:24:26 -------------------------------------------------------------------------------- Multi-Disciplinary Care Plan Details Patient Name: Date of Service: [name]. 08/29/2022 2:15 PM Medical Record Number: [id number] Patient Account Number: [id number] Date of Birth/Sex: Treating RN: 09/04/1986 (36 y.o.  Lucious Groves Primary Care Shanayah Kaffenberger: PCP, NO Other Clinician: Referring Ardell Aaronson: Treating Brennin Durfee/Extender: Trixie Rude NHO PE, CA THA RINE Weeks in Treatment: 2 Active Inactive Wound/Skin Impairment Nursing Diagnoses: Impaired tissue integrity Goals: Patient/caregiver will verbalize understanding of skin care regimen Date Initiated: 08/15/2022 Target Resolution Date: 10/04/2022 Goal Status: Active Ulcer/skin breakdown will have a volume reduction of 30% by week 4 Date Initiated: 08/15/2022 Target Resolution Date: 10/04/2022 Goal Status: Active Interventions: Assess patient/caregiver ability to obtain necessary supplies Assess patient/caregiver ability to perform ulcer/skin care regimen upon admission and as needed Assess ulceration(s) every visit Provide education on ulcer and skin care Treatment Activities: Topical wound management initiated : 08/15/2022 Notes: Electronic Signature(s) Signed: 08/29/2022 4:44:37 PM By: Fonnie Mu RN Entered By: Fonnie Mu on 08/29/2022 14:37:57 Mcginley, Cord M ([id number]) [id number] Page 5 of 8 -------------------------------------------------------------------------------- Pain Assessment Details Patient Name: Date of Service: ESSEX, PERRY. 08/29/2022 2:15 PM Medical  Record Number: [id number] Patient Account Number: [id number] Date of Birth/Sex: Treating RN: 08-Feb-1986 (36 y.o. Janyth Contes Primary Care Charlaine Utsey: PCP, NO Other Clinician: Referring Joy Haegele: Treating Suzzanne Brunkhorst/Extender: Sindy Messing NHO PE, CA THA RINE Weeks in Treatment: 2 Active Problems Location of Pain Severity and Description of Pain Patient Has Paino No Site Locations Rate the pain. Current Pain Level: 0 Pain Management and Medication Current Pain Management: Electronic Signature(s) Signed: 08/29/2022 4:24:03 PM By: Adline Peals Entered By: Adline Peals on 08/29/2022  14:25:43 -------------------------------------------------------------------------------- Patient/Caregiver Education Details Patient Name: Date of Service: Governor Rooks 10/26/2023andnbsp2:15 PM Medical Record Number: [id number] Patient Account Number: [id number] Date of Birth/Gender: Treating RN: Oct 27, 1986 (36 y.o. Erie Noe Primary Care Physician: PCP, NO Other Clinician: Referring Physician: Treating Physician/Extender: Sindy Messing NHO PE, CA THA RINE Weeks in Treatment: 2 Education Assessment Education Provided To: Patient Education Topics Provided Wound/Skin Impairment: Methods: Explain/Verbal Responses: State content correctly SHERREL, SHAFER ([id number]) [id number] Page 6 of 8 Electronic Signature(s) Signed: 08/29/2022 4:44:37 PM By: Rhae Hammock RN Entered By: Rhae Hammock on 08/29/2022 14:38:16 -------------------------------------------------------------------------------- Wound Assessment Details Patient Name: Date of Service: Governor Rooks. 08/29/2022 2:15 PM Medical Record Number: [id number] Patient Account Number: [id number] Date of Birth/Sex: Treating RN: December 02, 1985 (36 y.o. Janyth Contes Primary Care Tzirel Leonor: PCP, NO Other Clinician: Referring Dru Primeau: Treating Dailin Sosnowski/Extender: Sindy Messing NHO PE, CA THA RINE Weeks in Treatment: 2 Wound Status Wound Number: 1 Primary Etiology: Infection - not elsewhere classified Wound Location: Right Foot Wound Status: Open Wounding Event: Trauma Comorbid History: Hypertension Date Acquired: 07/31/2022 Weeks Of Treatment: 2 Clustered Wound: No Photos Wound Measurements Length: (cm) 8.4 Width: (cm) 8.5 Depth: (cm) 0.1 Area: (cm) 56.077 Volume: (cm) 5.608 % Reduction in Area: -52.2% % Reduction in Volume: -52.2% Epithelialization: Small (1-33%) Tunneling: No Undermining: No Wound Description Classification: Full Thickness  Without Exposed Suppor Wound Margin: Distinct, outline attached Exudate Amount: Medium Exudate Type: Serosanguineous Exudate Color: red, brown t Structures Foul Odor After Cleansing: No Slough/Fibrino Yes Wound Bed Granulation Amount: Medium (34-66%) Exposed Structure Granulation Quality: Red Fascia Exposed: No Necrotic Amount: Medium (34-66%) Fat Layer (Subcutaneous Tissue) Exposed: Yes Necrotic Quality: Eschar, Adherent Slough Tendon Exposed: No Muscle Exposed: No Joint Exposed: No Bone Exposed: No Periwound Skin Texture Texture Color No Abnormalities Noted: Yes No Abnormalities Noted: No Atrophie Blanche: No Moisture Cyanosis: No No Abnormalities Noted: No Ecchymosis: No Dry / ScalyCLIFORD, SEQUEIRA ([id number]) [id number] Page 7 of 8 Erythema: No Maceration: No Hemosiderin Staining: No Mottled: No Pallor: No Rubor: No Temperature / Pain Temperature: No Abnormality Treatment Notes Wound #1 (Foot) Wound Laterality: Right Cleanser Soap and Water Discharge Instruction: May shower and wash wound with dial antibacterial soap and water prior to dressing change. Peri-Wound Care Topical Lamisil 1% Antifungal Spray Discharge Instruction: apply first. topical antibiotics spray from [facility] Discharge Instruction: apply once it arrives start using and stop the dakin's solution. Primary Dressing Dakin's Solution 0.25%, 16 (oz) Discharge Instruction: Moisten gauze with Dakin's solution Secondary Dressing Woven Gauze Sponge, Non-Sterile 4x4 in Discharge Instruction: Apply over primary dressing as directed. Secured With Conforming Stretch Gauze Bandage, Sterile 2x75 (in/in) Discharge Instruction: Secure with stretch gauze as directed. Paper Tape, 2x10 (in/yd) Discharge Instruction: Secure dressing with tape as directed. Compression Wrap Compression Stockings Add-Ons Electronic Signature(s) Signed: 08/29/2022 4:24:03 PM By:  Adline Peals Entered By: Adline Peals on 08/29/2022 14:32:12 -------------------------------------------------------------------------------- Vitals Details Patient Name: Date of Service: Ollen Barges, Martyn Ehrich. 08/29/2022  2:15 PM Medical Record Number: [id number] Patient Account Number: [id number] Date of Birth/Sex: Treating RN: 07-14-1986 (36 y.o. [name] Primary Care Vallie Teters: PCP, NO Other Clinician: Referring Alauna Hayden: Treating Kehaulani Fruin/Extender: Trixie Rude NHO PE, CA THA RINE Weeks in Treatment: 2 Vital Signs Time Taken: 14:25 Temperature (F): 98.2 Height (in): 71 Pulse (bpm): 113 Weight (lbs): 195 Respiratory Rate (breaths/min): 18 Body Mass Index (BMI): 27.2 Blood Pressure (mmHg): 167/110 Reference Range: 80 - 120 mg / dl Electronic Signature(s) DAVIDJAMES, BLANSETT M ([id number]) [id number] Page 8 of 8 Signed: 08/29/2022 4:24:03 PM By: Samuella Bruin Entered By: Samuella Bruin on 08/29/2022 14:26:32

## 2022-08-29 NOTE — Progress Notes (Signed)
Mark, [name] ([id number]) [id number] Page 1 of 7 Visit Report for 08/29/2022 Chief Complaint Document Details Patient Name: Date of Service: Mark [name], Mark [name] 08/29/2022 2:15 PM Medical Record Number: [id number] Patient Account Number: [id number] Date of Birth/Sex: Treating RN: Mark [name] (36 y.o. Mark [name] Primary Care Provider: PCP, NO Other Clinician: Referring Provider: Treating Provider/Extender: Mark [name] NHO PE, CA THA RINE Weeks in Treatment: 2 Information Obtained from: Patient Chief Complaint 08/15/2022; right foot wounds Electronic Signature(s) Signed: 08/29/2022 4:37:04 PM By: Kalman Shan DO Entered By: Kalman Shan on 08/29/2022 15:24:33 -------------------------------------------------------------------------------- Debridement Details Patient Name: Date of Service: Mark [name]. 08/29/2022 2:15 PM Medical Record Number: [id number] Patient Account Number: [id number] Date of Birth/Sex: Treating RN: [name] (36 y.o. Mark [name], Mark [name] Primary Care Provider: PCP, NO Other Clinician: Referring Provider: Treating Provider/Extender: Mark [name] NHO PE, CA THA RINE Weeks in Treatment: 2 Debridement Performed for Assessment: Wound #1 Right Foot Performed By: Physician Kalman Shan, DO Debridement Type: Debridement Level of Consciousness (Pre-procedure): Awake and Alert Pre-procedure Verification/Time Out Yes - 14:40 Taken: Start Time: 14:40 Pain Control: Lidocaine T Area Debrided (L x W): otal 8.4 (cm) x 8.5 (cm) = 71.4 (cm) Tissue and other material debrided: Viable, Non-Viable, Slough, Slough Level: Non-Viable Tissue Debridement Description: Selective/Open Wound Instrument: Curette Bleeding: Minimum Hemostasis Achieved: Pressure End Time: 14:40 Procedural Pain: 0 Post Procedural Pain: 0 Response to Treatment: Procedure was tolerated well Level of Consciousness (Post- Awake and  Alert procedure): Post Debridement Measurements of Total Wound Length: (cm) 8.4 Width: (cm) 8.5 Depth: (cm) 0.1 Volume: (cm) 5.608 Character of Wound/Ulcer Post Debridement: Improved Post Procedure Diagnosis Mark [name], Mark [name] ([id number]) [id number] Page 2 of 7 Same as Pre-procedure Electronic Signature(s) Signed: 08/29/2022 4:37:04 PM By: Kalman Shan DO Signed: 08/29/2022 4:44:37 PM By: Mark Hammock RN Entered By: Mark [name] on 08/29/2022 14:43:09 -------------------------------------------------------------------------------- HPI Details Patient Name: Date of Service: Mark [name]. 08/29/2022 2:15 PM Medical Record Number: [id number] Patient Account Number: [id number] Date of Birth/Sex: Treating RN: [name] (36 y.o. Mark [name] Primary Care Provider: PCP, NO Other Clinician: Referring Provider: Treating Provider/Extender: Mark [name] NHO PE, CA THA RINE Weeks in Treatment: 2 History of Present Illness HPI Description: Admission 08/15/2022 [name] is a 36 year old male with essential hypertension that presents to the clinic for a 1-1/2-week history of right foot wounds. He states that there was some breakdown to his foot after scratching it and then he wore his steel toed shoes and started developing odor and breakdown of the skin. He went to the ED for this issue on 08/13/2022. He was given oral clindamycin and mupirocin ointment. He stated he started using this in the past 1 to 2 days. He currently denies systemic signs of infection. No history of diabetes. 10/19; patient presents for follow-up. He has been using mupirocin ointment with antifungal spray to the right foot wound. He had a PCR culture done at last clinic visit that grew high levels of Pseudomonas aeruginosa and Bacteroides fragilis. He had medium levels of Enterococcus bacillus and actinotignum schaalii. He currently denies systemic signs of  infection. He has no issues or complaints today. 10/26; patient presents for follow-up. He has been taking his oral antibiotics and has not completed this yet. He did not do Dakin's wet-to-dry dressings. He has been using the antifungal spray. He has reported improvement with the use of oral antibiotics. He states he paid for the [facility] antibiotic spray yesterday. He states that should  be coming in the mail in the next 2 days. Electronic Signature(s) Signed: 08/29/2022 4:37:04 PM By: Kalman Shan DO Entered By: Kalman Shan on 08/29/2022 15:25:15 -------------------------------------------------------------------------------- Physical Exam Details Patient Name: Date of Service: Mark [name] 08/29/2022 2:15 PM Medical Record Number: [id number] Patient Account Number: [id number] Date of Birth/Sex: Treating RN: [name] (36 y.o. Mark [name] Primary Care Provider: PCP, NO Other Clinician: Referring Provider: Treating Provider/Extender: Mark [name] NHO PE, CA THA RINE Weeks in Treatment: 2 Constitutional respirations regular, non-labored and within target range for patient.. Cardiovascular 2+ dorsalis pedis/posterior tibialis pulses. Psychiatric pleasant and cooperative. Notes Right foot: T the plantar aspect of the foot and in between the toes there is maceration with skin breakdown and Nonviable tissue. o No increased warmth, erythema or purulent drainage. No odor. Mark [name], Mark [name] ([id number]) [id number] Page 3 of 7 Electronic Signature(s) Signed: 08/29/2022 4:37:04 PM By: Kalman Shan DO Entered By: Kalman Shan on 08/29/2022 15:27:37 -------------------------------------------------------------------------------- Physician Orders Details Patient Name: Date of Service: Mark [name]. 08/29/2022 2:15 PM Medical Record Number: [id number] Patient Account Number: [id number] Date of Birth/Sex: Treating RN: [birth year] [name]  (36 y.o. Mark [name] Primary Care Provider: PCP, NO Other Clinician: Referring Provider: Treating Provider/Extender: Mark [name] NHO PE, CA THA RINE Weeks in Treatment: 2 Verbal / Phone Orders: No Diagnosis Coding Follow-up Appointments ppointment in 1 week. - with Dr. Heber [name] Return A Other: - [place] will call you about purchasing the topical antibiotics. Pick up oral antibiotics and Dakin's solution at your pharmacy. If you pharmacy does not have the Dakin's then purchase off of [place] or pick up at [facility]. ***Bring in [place] spray with you at your next appointment**** Anesthetic (In clinic) Topical Lidocaine 5% applied to wound bed Bathing/ Shower/ Hygiene May shower and wash wound with soap and water. Off-Loading Open toe surgical shoe to: Wound Treatment Wound #1 - Foot Wound Laterality: Right Cleanser: Soap and Water 1 x Per Day/30 Days Discharge Instructions: May shower and wash wound with dial antibacterial soap and water prior to dressing change. Topical: Lamisil 1% Antifungal Spray 1 x Per Day/30 Days Discharge Instructions: apply first. Topical: topical antibiotics spray from [place] 1 x Per Day/30 Days Discharge Instructions: apply once it arrives start using and stop the dakin's solution. Prim Dressing: Dakin's Solution 0.25%, 16 (oz) 1 x Per Day/30 Days ary Discharge Instructions: Moisten gauze with Dakin's solution Secondary Dressing: Woven Gauze Sponge, Non-Sterile 4x4 in 1 x Per Day/30 Days Discharge Instructions: Apply over primary dressing as directed. Secured With: Child psychotherapist, Sterile 2x75 (in/in) 1 x Per Day/30 Days Discharge Instructions: Secure with stretch gauze as directed. Secured With: Paper Tape, 2x10 (in/yd) 1 x Per Day/30 Days Discharge Instructions: Secure dressing with tape as directed. Electronic Signature(s) Signed: 08/29/2022 4:37:04 PM By: Kalman Shan DO Entered  By: Kalman Shan on 08/29/2022 15:30:52 Mark [name] ([id number]) [id number] Page 4 of 7 -------------------------------------------------------------------------------- Problem List Details Patient Name: Date of Service: TRAEGAN, CHARGUALAF 08/29/2022 2:15 PM Medical Record Number: [id number] Patient Account Number: [id number] Date of Birth/Sex: Treating RN: [name] (36 y.o. Mark [name] Primary Care Provider: PCP, NO Other Clinician: Referring Provider: Treating Provider/Extender: Mark [name] NHO PE, CA THA RINE Weeks in Treatment: 2 Active Problems ICD-10 Encounter Code Description Active Date MDM Diagnosis S91.301A Unspecified open wound, right foot, initial encounter 08/15/2022 No Yes L08.9 Local infection of the skin and subcutaneous tissue, unspecified 08/15/2022 No Yes I10 Essential (  primary) hypertension 08/15/2022 No Yes Inactive Problems Resolved Problems Electronic Signature(s) Signed: 08/29/2022 4:37:04 PM By: Kalman Shan DO Entered By: Kalman Shan on 08/29/2022 15:24:21 -------------------------------------------------------------------------------- Progress Note Details Patient Name: Date of Service: Mark [name]. 08/29/2022 2:15 PM Medical Record Number: [id number] Patient Account Number: [id number] Date of Birth/Sex: Treating RN: [birth year][name] (36 y.o. Mark [name] Primary Care Provider: PCP, NO Other Clinician: Referring Provider: Treating Provider/Extender: Mark [name] NHO PE, CA THA RINE Weeks in Treatment: 2 Subjective Chief Complaint Information obtained from Patient 08/15/2022; right foot wounds History of Present Illness (HPI) Admission 08/15/2022 Mr. Walter Modeste is a 36 year old male with essential hypertension that presents to the clinic for a 1-1/2-week history of right foot wounds. He states that there was some breakdown to his foot after scratching it and then he wore  his steel toed shoes and started developing odor and breakdown of the skin. He went to the ED for this issue on 08/13/2022. He was given oral clindamycin and mupirocin ointment. He stated he started using this in the past 1 to 2 days. He currently denies systemic signs of infection. No history of diabetes. 10/19; patient presents for follow-up. He has been using mupirocin ointment with antifungal spray to the right foot wound. He had a PCR culture done at last clinic visit that grew high levels of Pseudomonas aeruginosa and Bacteroides fragilis. He had medium levels of Enterococcus bacillus and actinotignum schaalii. He currently denies systemic signs of infection. He has no issues or complaints today. 10/26; patient presents for follow-up. He has been taking his oral antibiotics and has not completed this yet. He did not do Dakin's wet-to-dry dressings. He has been using the antifungal spray. He has reported improvement with the use of oral antibiotics. He states he paid for the [facility] antibiotic spray yesterday. He states that should be coming in the mail in the next 2 days. Patient History Information obtained from Patient, Chart. Mark [name], Mark [name] ([id number]) [id number] Page 5 of 7 Family History Cancer - Maternal Grandparents, Hypertension - Father, No family history of Diabetes, Heart Disease, Hereditary Spherocytosis, Kidney Disease, Lung Disease, Seizures, Stroke, Thyroid Problems, Tuberculosis. Social History Former smoker - Environmental consultant, Marital Status - Single, Alcohol Use - Rarely, Drug Use - Prior History, Caffeine Use - Daily. Medical History Cardiovascular Patient has history of Hypertension Objective Constitutional respirations regular, non-labored and within target range for patient.. Vitals Time Taken: 2:25 PM, Height: 71 in, Weight: 195 lbs, BMI: 27.2, Temperature: 98.2 F, Pulse: 113 bpm, Respiratory Rate: 18 breaths/min, Blood Pressure: 167/110  mmHg. Cardiovascular 2+ dorsalis pedis/posterior tibialis pulses. Psychiatric pleasant and cooperative. General Notes: Right foot: T the plantar aspect of the foot and in between the toes there is maceration with skin breakdown and Nonviable tissue. No o increased warmth, erythema or purulent drainage. No odor. Integumentary (Hair, Skin) Wound #1 status is Open. Original cause of wound was Trauma. The date acquired was: 07/31/2022. The wound has been in treatment 2 weeks. The wound is located on the Right Foot. The wound measures 8.4cm length x 8.5cm width x 0.1cm depth; 56.077cm^2 area and 5.608cm^3 volume. There is Fat Layer (Subcutaneous Tissue) exposed. There is no tunneling or undermining noted. There is a medium amount of serosanguineous drainage noted. The wound margin is distinct with the outline attached to the wound base. There is medium (34-66%) red granulation within the wound bed. There is a medium (34-66%) amount of necrotic tissue within the wound bed including Eschar and Adherent Slough.  The periwound skin appearance had no abnormalities noted for texture. The periwound skin appearance exhibited: Dry/Scaly. The periwound skin appearance did not exhibit: Maceration, Atrophie Blanche, Cyanosis, Ecchymosis, Hemosiderin Staining, Mottled, Pallor, Rubor, Erythema. Periwound temperature was noted as No Abnormality. Assessment Active Problems ICD-10 Unspecified open wound, right foot, initial encounter Local infection of the skin and subcutaneous tissue, unspecified Essential (primary) hypertension Patient's wound has shown improvement in size and appearance since last clinic visit. I debrided nonviable tissue. I recommended completing his course of antibiotics. He can continue antifungal spray. He should receive his [place] antibiotic spray soon. He can start this once it arrives. I asked him to bring it to next clinic visit. Follow-up in 1 week. Procedures Wound #1 Pre-procedure  diagnosis of Wound #1 is an Infection - not elsewhere classified located on the Right Foot . There was a Selective/Open Wound Non-Viable Tissue Debridement with a total area of 71.4 sq cm performed by Kalman Shan, DO. With the following instrument(s): Curette to remove Viable and Non- Viable tissue/material. Material removed includes [place] after achieving pain control using Lidocaine. No specimens were taken. A time out was conducted at 14:40, prior to the start of the procedure. A Minimum amount of bleeding was controlled with Pressure. The procedure was tolerated well with a pain level of 0 throughout and a pain level of 0 following the procedure. Post Debridement Measurements: 8.4cm length x 8.5cm width x 0.1cm depth; 5.608cm^3 volume. Character of Wound/Ulcer Post Debridement is improved. Post procedure Diagnosis Wound #1: Same as Pre-Procedure Plan Mark [name], Mark [name] ([id number]) [id number] Page 6 of 7 Follow-up Appointments: Return Appointment in 1 week. - with Dr. Heber [place] Other: - [place] will call you about purchasing the topical antibiotics. Pick up oral antibiotics and Dakin's solution at your pharmacy. If you pharmacy does not have the Dakin's then purchase off of [place] or pick up at [facility]. ***Bring in [place] spray with you at your next appointment**** Anesthetic: (In clinic) Topical Lidocaine 5% applied to wound bed Bathing/ Shower/ Hygiene: May shower and wash wound with soap and water. Off-Loading: Open toe surgical shoe to: WOUND #1: - Foot Wound Laterality: Right Cleanser: Soap and Water 1 x Per Day/30 Days Discharge Instructions: May shower and wash wound with dial antibacterial soap and water prior to dressing change. Topical: Lamisil 1% Antifungal Spray 1 x Per Day/30 Days Discharge Instructions: apply first. Topical: topical antibiotics spray from [place] 1 x Per Day/30 Days Discharge Instructions: apply  once it arrives start using and stop the dakin's solution. Prim Dressing: Dakin's Solution 0.25%, 16 (oz) 1 x Per Day/30 Days ary Discharge Instructions: Moisten gauze with Dakin's solution Secondary Dressing: Woven Gauze Sponge, Non-Sterile 4x4 in 1 x Per Day/30 Days Discharge Instructions: Apply over primary dressing as directed. Secured With: Child psychotherapist, Sterile 2x75 (in/in) 1 x Per Day/30 Days Discharge Instructions: Secure with stretch gauze as directed. Secured With: Paper T ape, 2x10 (in/yd) 1 x Per Day/30 Days Discharge Instructions: Secure dressing with tape as directed. 1. Come pleat oral antibiotics 2. Continue antifungal spray 3. Start [place] antibiotic spray once it arrives 4. Follow-up in 1 week 5. Continue surgical shoe Electronic Signature(s) Signed: 08/29/2022 4:37:04 PM By: Kalman Shan DO Entered By: Kalman Shan on 08/29/2022 15:34:18 -------------------------------------------------------------------------------- HxROS Details Patient Name: Date of Service: Mark [name]. 08/29/2022 2:15 PM Medical Record Number: [id number] Patient Account Number: [id number] Date of Birth/Sex: Treating RN: [birth year] [name] (36 y.o. Mark [name] Primary Care Provider:  PCP, NO Other Clinician: Referring Provider: Treating Provider/Extender: Mark [name] NHO PE, CA THA RINE Weeks in Treatment: 2 Information Obtained From Patient Chart Cardiovascular Medical History: Positive for: Hypertension Immunizations Pneumococcal Vaccine: Received Pneumococcal Vaccination: No Implantable Devices None Family and Social History Cancer: Yes - Maternal Grandparents; Diabetes: No; Heart Disease: No; Hereditary Spherocytosis: No; Hypertension: Yes - Father; Kidney Disease: No; Lung Disease: No; Seizures: No; Stroke: No; Thyroid Problems: No; Tuberculosis: No; Former smoker - Vapes; Marital Status - Single; Alcohol Use: Rarely; Drug Use: Prior  History; Caffeine Use: Daily; Financial Concerns: No; Food, Clothing or Shelter Needs: No; Support System Lacking: No; Transportation Concerns: No Mark [name], Mark [name] ([id number]) [id number] Page 7 of 7 Electronic Signature(s) Signed: 08/29/2022 4:37:04 PM By: Kalman Shan DO Signed: 08/29/2022 5:00:15 PM By: Deon Pilling RN, BSN Entered By: Kalman Shan on 08/29/2022 15:25:20 -------------------------------------------------------------------------------- SuperBill Details Patient Name: Date of Service: Mark [name] 08/29/2022 Medical Record Number: [id number] Patient Account Number: [id number] Date of Birth/Sex: Treating RN: [birth year][name] (36 y.o. Mark [name] Primary Care Provider: PCP, NO Other Clinician: Referring Provider: Treating Provider/Extender: Mark [name] NHO PE, CA THA RINE Weeks in Treatment: 2 Diagnosis Coding ICD-10 Codes Code Description J85.631S Unspecified open wound, right foot, initial encounter L08.9 Local infection of the skin and subcutaneous tissue, unspecified I10 Essential (primary) hypertension Facility Procedures : CPT4 Code: [id number] Description: 58850 - DEBRIDE WOUND 1ST 20 SQ CM OR < ICD-10 Diagnosis Description S91.301A Unspecified open wound, right foot, initial encounter Modifier: Quantity: 1 : CPT4 Code: [id number] Description: 86767 - DEBRIDE WOUND EA ADDL 20 SQ CM ICD-10 Diagnosis Description S91.301A Unspecified open wound, right foot, initial encounter Modifier: Quantity: 3 Physician Procedures : CPT4 Code Description Modifier [id number] 62836 - WC PHYS DEBR WO ANESTH 20 SQ CM ICD-10 Diagnosis Description S91.301A Unspecified open wound, right foot, initial encounter Quantity: 1 : [id number] 46503 - WC PHYS DEBR WO ANESTH EA ADD 20 CM ICD-10 Diagnosis Description S91.301A Unspecified open wound, right foot, initial encounter Quantity: 3 Electronic Signature(s) Signed: 08/29/2022 4:37:04 PM  By: Kalman Shan DO Entered By: Kalman Shan on 08/29/2022 15:34:28

## 2022-09-05 ENCOUNTER — Encounter (HOSPITAL_BASED_OUTPATIENT_CLINIC_OR_DEPARTMENT_OTHER): Payer: Self-pay | Attending: Internal Medicine | Admitting: Internal Medicine

## 2022-09-05 NOTE — Progress Notes (Addendum)
CAIDEN, ARTEAGA ([id number]) [id number] Page 1 of 4 Visit Report for 09/05/2022 HPI Details Patient Name: Date of Service: Mark, [name]. 09/05/2022 2:15 PM Medical Record Number: [id number] Patient Account Number: [id number] Date of Birth/Sex: Treating RN: [birth year] (36 y.o. M) Primary Care Provider: PCP, NO Other Clinician: Referring Provider: Treating Provider/Extender: Erick Colace NHO PE, CA THA RINE Weeks in Treatment: 3 History of Present Illness HPI Description: Admission 08/15/2022 [name] is a 36 year old male with essential hypertension that presents to the clinic for a 1-1/2-week history of right foot wounds. He states that there was some breakdown to his foot after scratching it and then he wore his steel toed shoes and started developing odor and breakdown of the skin. He went to the ED for this issue on 08/13/2022. He was given oral clindamycin and mupirocin ointment. He stated he started using this in the past 1 to 2 days. He currently denies systemic signs of infection. No history of diabetes. 10/19; patient presents for follow-up. He has been using mupirocin ointment with antifungal spray to the right foot wound. He had a PCR culture done at last clinic visit that grew high levels of Pseudomonas aeruginosa and Bacteroides fragilis. He had medium levels of Enterococcus bacillus and actinotignum schaalii. He currently denies systemic signs of infection. He has no issues or complaints today. 10/26; patient presents for follow-up. He has been taking his oral antibiotics and has not completed this yet. He did not do Dakin's wet-to-dry dressings. He has been using the antifungal spray. He has reported improvement with the use of oral antibiotics. He states he paid for the [facility] antibiotic spray yesterday. He states that should be coming in the mail in the next 2 days. . 11/2; the patient's wounds on his right foot are all healed.  Looking at pictures a considerable improvement. I suspect this was tinea pedis complicated by secondary bacterial infection fortunately all successfully treated by Dr. [name]. Electronic Signature(s) Signed: 09/05/2022 4:03:30 PM By: Baltazar Najjar MD Entered By: Baltazar Najjar on 09/05/2022 15:55:26 -------------------------------------------------------------------------------- Physical Exam Details Patient Name: Date of Service: Mark [name]. 09/05/2022 2:15 PM Medical Record Number: [id number] Patient Account Number: [id number] Date of Birth/Sex: Treating RN: 05/01/1986 (36 y.o. M) Primary Care Provider: PCP, NO Other Clinician: Referring Provider: Treating Provider/Extender: Erick Colace NHO PE, CA THA RINE Weeks in Treatment: 3 Constitutional Patient is hypertensive.. Pulse regular and within target range for patient.Marland Kitchen Respirations regular, non-labored and within target range.. Temperature is normal and within the target range for the patient.Marland Kitchen Appears in no distress. Notes Right foot. His right forefoot is completely healed. There is no open wounds between his toes. Still dry flaking skin but everything looks considerably better here. Electronic Signature(s) Signed: 09/05/2022 4:03:30 PM By: Baltazar Najjar MD Entered By: Baltazar Najjar on 09/05/2022 15:56:27 Mark [name] ([id number]) [id number] Page 2 of 4 -------------------------------------------------------------------------------- Physician Orders Details Patient Name: Date of Service: Mark, [name] 09/05/2022 2:15 PM Medical Record Number: [id number] Patient Account Number: [id number] Date of Birth/Sex: Treating RN: 1985-11-10 (36 y.o. [name] Primary Care Provider: PCP, NO Other Clinician: Referring Provider: Treating Provider/Extender: Erick Colace NHO PE, CA THA RINE Weeks in Treatment: 3 Verbal / Phone Orders: No Diagnosis Coding Additional  Orders / Instructions Other: - keep feet clean and dry Electronic Signature(s) Signed: 09/05/2022 4:03:30 PM By: Baltazar Najjar MD Signed: 09/05/2022 4:28:08 PM By: Samuella Bruin Entered By: Samuella Bruin on 09/05/2022 15:11:16 -------------------------------------------------------------------------------- Problem List  Details Patient Name: Date of Service: Mark, [name] 09/05/2022 2:15 PM Medical Record Number: [id number] Patient Account Number: [id number] Date of Birth/Sex: Treating RN: 03-17-1986 (36 y.o. M) Primary Care Provider: PCP, NO Other Clinician: Referring Provider: Treating Provider/Extender: Cain Sieve NHO PE, CA THA RINE Weeks in Treatment: 3 Active Problems ICD-10 Encounter Code Description Active Date MDM Diagnosis S91.301A Unspecified open wound, right foot, initial encounter 08/15/2022 No Yes L08.9 Local infection of the skin and subcutaneous tissue, unspecified 08/15/2022 No Yes I10 Essential (primary) hypertension 08/15/2022 No Yes Inactive Problems Resolved Problems Electronic Signature(s) Signed: 09/05/2022 4:03:30 PM By: Linton Ham MD Entered By: Linton Ham on 09/05/2022 15:29:10 Mark [name] ([id number]) [id number] Page 3 of 4 -------------------------------------------------------------------------------- Progress Note Details Patient Name: Date of Service: Mark, [name] 09/05/2022 2:15 PM Medical Record Number: [id number] Patient Account Number: [id number] Date of Birth/Sex: Treating RN: August 21, 1986 (36 y.o. M) Primary Care Provider: PCP, NO Other Clinician: Referring Provider: Treating Provider/Extender: Cain Sieve NHO PE, CA THA RINE Weeks in Treatment: 3 Subjective History of Present Illness (HPI) Admission 08/15/2022 [name] is a 36 year old male with essential hypertension that presents to the clinic for a 1-1/2-week history of right foot wounds. He states  that there was some breakdown to his foot after scratching it and then he wore his steel toed shoes and started developing odor and breakdown of the skin. He went to the ED for this issue on 08/13/2022. He was given oral clindamycin and mupirocin ointment. He stated he started using this in the past 1 to 2 days. He currently denies systemic signs of infection. No history of diabetes. 10/19; patient presents for follow-up. He has been using mupirocin ointment with antifungal spray to the right foot wound. He had a PCR culture done at last clinic visit that grew high levels of Pseudomonas aeruginosa and Bacteroides fragilis. He had medium levels of Enterococcus bacillus and actinotignum schaalii. He currently denies systemic signs of infection. He has no issues or complaints today. 10/26; patient presents for follow-up. He has been taking his oral antibiotics and has not completed this yet. He did not do Dakin's wet-to-dry dressings. He has been using the antifungal spray. He has reported improvement with the use of oral antibiotics. He states he paid for the [facility] antibiotic spray yesterday. He states that should be coming in the mail in the next 2 days. . 11/2; the patient's wounds on his right foot are all healed. Looking at pictures a considerable improvement. I suspect this was tinea pedis complicated by secondary bacterial infection fortunately all successfully treated by Dr. Heber . Objective Constitutional Patient is hypertensive.. Pulse regular and within target range for patient.Marland Kitchen Respirations regular, non-labored and within target range.. Temperature is normal and within the target range for the patient.Marland Kitchen Appears in no distress. Vitals Time Taken: 2:50 PM, Height: 71 in, Weight: 195 lbs, BMI: 27.2, Temperature: 98.3 F, Pulse: 90 bpm, Respiratory Rate: 18 breaths/min, Blood Pressure: 152/102 mmHg. General Notes: Right foot. His right forefoot is completely healed. There is no open  wounds between his toes. Still dry flaking skin but everything looks considerably better here. Integumentary (Hair, Skin) Wound #1 status is Open. Original cause of wound was Trauma. The date acquired was: 07/31/2022. The wound has been in treatment 3 weeks. The wound is located on the Right Foot. The wound measures 0cm length x 0cm width x 0cm depth; 0cm^2 area and 0cm^3 volume. The wound is limited to skin breakdown. There is  no tunneling or undermining noted. There is a none present amount of drainage noted. The wound margin is flat and intact. There is no granulation within the wound bed. There is no necrotic tissue within the wound bed. The periwound skin appearance had no abnormalities noted for texture. The periwound skin appearance exhibited: Dry/Scaly. The periwound skin appearance did not exhibit: Maceration, Atrophie Blanche, Cyanosis, Ecchymosis, Hemosiderin Staining, Mottled, Pallor, Rubor, Erythema. Periwound temperature was noted as No Abnormality. Assessment Active Problems ICD-10 Unspecified open wound, right foot, initial encounter Local infection of the skin and subcutaneous tissue, unspecified Essential (primary) hypertension Plan Mark, [name] ([id number]) [id number] Page 4 of 4 Additional Orders / Instructions: Other: - keep feet clean and dry . #1 everything is healed 2. Likely tinea pedis was complicating bacterial cellulitis successfully treated 3. I advised him to keep his feet clean and dry. Ongoing inspection especially between his toes Electronic Signature(s) Signed: 09/05/2022 4:03:30 PM By: Baltazar Najjar MD Entered By: Baltazar Najjar on 09/05/2022 15:58:19 -------------------------------------------------------------------------------- SuperBill Details Patient Name: Date of Service: Mark [name]. 09/05/2022 Medical Record Number: [id number] Patient Account Number: [id number] Date of Birth/Sex: Treating RN: 03/16/1986 (36  y.o. M) Primary Care Provider: PCP, NO Other Clinician: Referring Provider: Treating Provider/Extender: Erick Colace NHO PE, CA THA RINE Weeks in Treatment: 3 Diagnosis Coding ICD-10 Codes Code Description S91.301A Unspecified open wound, right foot, initial encounter L08.9 Local infection of the skin and subcutaneous tissue, unspecified I10 Essential (primary) hypertension Physician Procedures : CPT4 Code Description Modifier [id number] - WC PHYS LEVEL 2 - EST PT ICD-10 Diagnosis Description S91.301A Unspecified open wound, right foot, initial encounter L08.9 Local infection of the skin and subcutaneous tissue, unspecified Quantity: 1 Electronic Signature(s) Signed: 09/09/2022 8:33:39 AM By: Sloan Leiter Signed: 09/11/2022 3:30:35 PM By: Baltazar Najjar MD Previous Signature: 09/05/2022 4:03:30 PM Version By: Baltazar Najjar MD Entered By: Sloan Leiter on 09/09/2022 08:33:39

## 2022-09-05 NOTE — Progress Notes (Signed)
JARIN, Mark [name] ([id number]) [id number] Page 1 of 6 Visit Report for 09/05/2022 Arrival Information Details Patient Name: Date of Service: Mark [name], Mark [name]. 09/05/2022 2:15 PM Medical Record Number: [id number] Patient Account Number: [id number] Date of Birth/Sex: Treating RN: 11-05-1985 (36 y.o. Mark [name] Primary Care Erdine Hulen: PCP, NO Other Clinician: Referring Diar Berkel: Treating Ymani Porcher/Extender: Cain Sieve NHO PE, CA THA RINE Weeks in Treatment: 3 Visit Information History Since Last Visit Added or deleted any medications: No Patient Arrived: Ambulatory Any new allergies or adverse reactions: No Arrival Time: 14:50 Had a fall or experienced change in No Accompanied By: self activities of daily living that may affect Transfer Assistance: None risk of falls: Patient Identification Verified: Yes Signs or symptoms of abuse/neglect since last visito No Secondary Verification Process Completed: Yes Hospitalized since last visit: No Patient Requires Transmission-Based Precautions: No Implantable device outside of the clinic excluding No Patient Has Alerts: No cellular tissue based products placed in the center since last visit: Has Dressing in Place as Prescribed: Yes Pain Present Now: No Electronic Signature(s) Signed: 09/05/2022 4:28:08 PM By: Adline Peals Entered By: Adline Peals on 09/05/2022 14:50:22 -------------------------------------------------------------------------------- Encounter Discharge Information Details Patient Name: Date of Service: Mark Rooks. 09/05/2022 2:15 PM Medical Record Number: [id number] Patient Account Number: [id number] Date of Birth/Sex: Treating RN: Sep 03, 1986 (36 y.o. Mark [name] Primary Care Selden Noteboom: PCP, NO Other Clinician: Referring Melvin Whiteford: Treating Asani Deniston/Extender: Cain Sieve NHO PE, CA THA RINE Weeks in Treatment: 3 Encounter Discharge Information  Items Discharge Condition: Stable Ambulatory Status: Ambulatory Discharge Destination: Home Transportation: Private Auto Accompanied By: self Schedule Follow-up Appointment: Yes Clinical Summary of Care: Patient Declined Electronic Signature(s) Signed: 09/05/2022 4:28:08 PM By: Adline Peals Entered By: Adline Peals on 09/05/2022 16:19:09 Lysle Pearl ([id number] Page 2 of 6 -------------------------------------------------------------------------------- Lower Extremity Assessment Details Patient Name: Date of Service: Mark [name], Mark [name] 09/05/2022 2:15 PM Medical Record Number: [id number] Patient Account Number: [id number] Date of Birth/Sex: Treating RN: 23-Dec-1985 (36 y.o. Mark [name] Primary Care Lokelani Lutes: PCP, NO Other Clinician: Referring Joellen Tullos: Treating Curlie Macken/Extender: Cain Sieve NHO PE, CA THA RINE Weeks in Treatment: 3 Edema Assessment Assessed: [Left: No] [Right: No] Edema: [Left: N] [Right: o] Calf Left: Right: Point of Measurement: 36 cm From Medial Instep 38 cm Ankle Left: Right: Point of Measurement: 8 cm From Medial Instep 24.2 cm Electronic Signature(s) Signed: 09/05/2022 4:28:08 PM By: Adline Peals Entered By: Adline Peals on 09/05/2022 14:53:01 -------------------------------------------------------------------------------- Multi Wound Chart Details Patient Name: Date of Service: Mark Rooks. 09/05/2022 2:15 PM Medical Record Number: [id number] Patient Account Number: [id number] Date of Birth/Sex: Treating RN: December 08, 1985 (36 y.o. M) Primary Care Torian Quintero: PCP, NO Other Clinician: Referring Ahmani Prehn: Treating Dayle Sherpa/Extender: Cain Sieve NHO PE, CA THA RINE Weeks in Treatment: 3 Vital Signs Height(in): 71 Pulse(bpm): 90 Weight(lbs): 195 Blood Pressure(mmHg): 152/102 Body Mass Index(BMI): 27.2 Temperature(F): 98.3 Respiratory Rate(breaths/min):  18 [1:Photos:] [N/A:N/A] Right Foot N/A N/A Wound Location: Trauma N/A N/A Wounding Event: Infection - not elsewhere classified N/A N/A Primary Etiology: Hypertension N/A N/A Comorbid History: 07/31/2022 N/A N/A Date Acquired: 3 N/A N/A Suella [place] of TreatmentHERBY, BIGA ([id number]) [id number] Page 3 of 6 Open N/A N/A Wound Status: No N/A N/A Wound Recurrence: 0x0x0 N/A N/A Measurements L x W x D (cm) 0 N/A N/A A (cm) : rea 0 N/A N/A Volume (cm) : 100.00% N/A N/A % Reduction in Area: 100.00% N/A N/A % Reduction in Volume: Full Thickness Without Exposed  N/A N/A Classification: Support Structures None Present N/A N/A Exudate Amount: Flat and Intact N/A N/A Wound Margin: None Present (0%) N/A N/A Granulation Amount: None Present (0%) N/A N/A Necrotic Amount: Fascia: No N/A N/A Exposed Structures: Fat Layer (Subcutaneous Tissue): No Tendon: No Muscle: No Joint: No Bone: No Limited to Skin Breakdown Large (67-100%) N/A N/A Epithelialization: Excoriation: No N/A N/A Periwound Skin Texture: Induration: No Callus: No Crepitus: No Rash: No Scarring: No Dry/Scaly: Yes N/A N/A Periwound Skin Moisture: Maceration: No Atrophie Blanche: No N/A N/A Periwound Skin Color: Cyanosis: No Ecchymosis: No Erythema: No Hemosiderin Staining: No Mottled: No Pallor: No Rubor: No No Abnormality N/A N/A Temperature: Treatment Notes Electronic Signature(s) Signed: 09/05/2022 4:03:30 PM By: Linton Ham MD Entered By: Linton Ham on 09/05/2022 15:29:17 -------------------------------------------------------------------------------- Multi-Disciplinary Care Plan Details Patient Name: Date of Service: Mark Rooks. 09/05/2022 2:15 PM Medical Record Number: [id number] Patient Account Number: [id number] Date of Birth/Sex: Treating RN: 04/16/1986 (36 y.o. Mark [name] Primary Care Jermiya Reichl: PCP, NO Other  Clinician: Referring Valera Vallas: Treating Prim Morace/Extender: Cain Sieve NHO PE, CA THA RINE Weeks in Treatment: 3 Active Inactive Electronic Signature(s) Signed: 09/05/2022 4:28:08 PM By: Adline Peals Entered By: Adline Peals on 09/05/2022 16:19:18 Lysle Pearl ([id number]) [id number] Page 4 of 6 -------------------------------------------------------------------------------- Pain Assessment Details Patient Name: Date of Service: Mark [name], Mark [name] 09/05/2022 2:15 PM Medical Record Number: [id number] Patient Account Number: [id number] Date of Birth/Sex: Treating RN: 10/09/1986 (36 y.o. Mark [name] Primary Care Yee Gangi: PCP, NO Other Clinician: Referring Dujuan Stankowski: Treating Lollie Gunner/Extender: Cain Sieve NHO PE, CA THA RINE Weeks in Treatment: 3 Active Problems Location of Pain Severity and Description of Pain Patient Has Paino No Site Locations Rate the pain. Current Pain Level: 0 Pain Management and Medication Current Pain Management: Electronic Signature(s) Signed: 09/05/2022 4:28:08 PM By: Adline Peals Entered By: Adline Peals on 09/05/2022 14:50:59 -------------------------------------------------------------------------------- Patient/Caregiver Education Details Patient Name: Date of Service: Mark Rooks 11/2/2023andnbsp2:15 PM Medical Record Number: [id number] Patient Account Number: [id number] Date of Birth/Gender: Treating RN: [birth year] (36 y.o. Mark [name] Primary Care Physician: PCP, NO Other Clinician: Referring Physician: Treating Physician/Extender: Cain Sieve NHO PE, CA THA RINE Weeks in Treatment: 3 Education Assessment Education Provided To: Patient Education Topics Provided Wound/Skin Impairment: Methods: Explain/Verbal Responses: Reinforcements needed, State content correctly Electronic Signature(s) Signed: 09/05/2022 4:28:08 PM By: Adline Peals Entered By: Adline Peals on 09/05/2022 14:55:17 Lysle Pearl ([id number]) [id number] Page 5 of 6 -------------------------------------------------------------------------------- Wound Assessment Details Patient Name: Date of Service: Mark [name], Mark [name] 09/05/2022 2:15 PM Medical Record Number: [id number] Patient Account Number: [id number] Date of Birth/Sex: Treating RN: 20-Apr-1986 (36 y.o. Mark [name] Primary Care Ricahrd Schwager: PCP, NO Other Clinician: Referring Cecylia Brazill: Treating Tyesha Joffe/Extender: Cain Sieve NHO PE, CA THA RINE Weeks in Treatment: 3 Wound Status Wound Number: 1 Primary Etiology: Infection - not elsewhere classified Wound Location: Right Foot Wound Status: Open Wounding Event: Trauma Comorbid History: Hypertension Date Acquired: 07/31/2022 Weeks Of Treatment: 3 Clustered Wound: No Photos Wound Measurements Length: (cm) Width: (cm) Depth: (cm) Area: (cm) Volume: (cm) 0 % Reduction in Area: 100% 0 % Reduction in Volume: 100% 0 Epithelialization: Large (67-100%) 0 Tunneling: No 0 Undermining: No Wound Description Classification: Full Thickness Without Exposed Support Wound Margin: Flat and Intact Exudate Amount: None Present Structures Foul Odor After Cleansing: No Slough/Fibrino No Wound Bed Granulation Amount: None Present (0%) Exposed Structure Necrotic Amount: None Present (0%) Fascia Exposed: No Fat Layer (Subcutaneous Tissue)  Exposed: No Tendon Exposed: No Muscle Exposed: No Joint Exposed: No Bone Exposed: No Limited to Skin Breakdown Periwound Skin Texture Texture Color No Abnormalities Noted: Yes No Abnormalities Noted: No Atrophie Blanche: No Moisture Cyanosis: No No Abnormalities Noted: No Ecchymosis: No Dry / Scaly: Yes Erythema: No Maceration: No Hemosiderin Staining: No Mottled: No Pallor: No Rubor: No Temperature / Pain Temperature: No Abnormality [name], Mark [name] ([id number] Page 6 of 6 Electronic Signature(s) Signed: 09/05/2022 4:28:08 PM By: Adline Peals Entered By: Adline Peals on 09/05/2022 15:09:54 -------------------------------------------------------------------------------- Vitals Details Patient Name: Date of Service: Mark Rooks. 09/05/2022 2:15 PM Medical Record Number: [id number] Patient Account Number: [id number] Date of Birth/Sex: Treating RN: November 27, 1985 (36 y.o. Mark [name] Primary Care Bray Vickerman: PCP, NO Other Clinician: Referring Rande Dario: Treating Toyia Jelinek/Extender: Cain Sieve NHO PE, CA THA RINE Weeks in Treatment: 3 Vital Signs Time Taken: 14:50 Temperature (F): 98.3 Height (in): 71 Pulse (bpm): 90 Weight (lbs): 195 Respiratory Rate (breaths/min): 18 Body Mass Index (BMI): 27.2 Blood Pressure (mmHg): 152/102 Reference Range: 80 - 120 mg / dl Electronic Signature(s) Signed: 09/05/2022 4:28:08 PM By: Adline Peals Entered By: Adline Peals on 09/05/2022 14:50:53
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
## 2022-10-03 ENCOUNTER — Ambulatory Visit: Payer: Self-pay | Admitting: Family Medicine

## 2022-11-08 ENCOUNTER — Ambulatory Visit: Payer: Self-pay | Admitting: Family Medicine

## 2022-12-31 ENCOUNTER — Ambulatory Visit: Payer: Self-pay | Admitting: Family Medicine
